# Patient Record
Sex: Male | Born: 2000 | Race: Black or African American | Hispanic: No | Marital: Single | Smoking: Never smoker
Health system: Southern US, Community
[De-identification: ages and names within clinical notes are randomized; demographics above are authoritative.]

## PROBLEM LIST (undated history)

## (undated) DIAGNOSIS — J45909 Unspecified asthma, uncomplicated: Secondary | ICD-10-CM

---
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
## 2020-10-04 ENCOUNTER — Other Ambulatory Visit: Payer: Self-pay

## 2020-10-04 ENCOUNTER — Emergency Department (HOSPITAL_BASED_OUTPATIENT_CLINIC_OR_DEPARTMENT_OTHER)
Admission: EM | Admit: 2020-10-04 | Discharge: 2020-10-04 | Disposition: A | Discharge status: Home / Self Care | Payer: Self-pay | Attending: Emergency Medicine | Admitting: Emergency Medicine

## 2020-10-04 ENCOUNTER — Encounter (HOSPITAL_BASED_OUTPATIENT_CLINIC_OR_DEPARTMENT_OTHER): Payer: Self-pay

## 2020-10-04 ENCOUNTER — Other Ambulatory Visit (HOSPITAL_COMMUNITY): Payer: Self-pay | Admitting: Physician Assistant

## 2020-10-04 DIAGNOSIS — R110 Nausea: Secondary | ICD-10-CM | POA: Insufficient documentation

## 2020-10-04 DIAGNOSIS — R369 Urethral discharge, unspecified: Secondary | ICD-10-CM | POA: Insufficient documentation

## 2020-10-04 DIAGNOSIS — R1030 Lower abdominal pain, unspecified: Secondary | ICD-10-CM | POA: Insufficient documentation

## 2020-10-04 DIAGNOSIS — Z7689 Persons encountering health services in other specified circumstances: Secondary | ICD-10-CM

## 2020-10-04 DIAGNOSIS — R109 Unspecified abdominal pain: Secondary | ICD-10-CM

## 2020-10-04 LAB — URINALYSIS, ROUTINE W REFLEX MICROSCOPIC
Bilirubin Urine: NEGATIVE
Glucose, UA: NEGATIVE mg/dL
Ketones, ur: NEGATIVE mg/dL
Nitrite: NEGATIVE
Protein, ur: NEGATIVE mg/dL
Specific Gravity, Urine: >1.03 — ABNORMAL HIGH (ref 1.005–1.030)
pH: 6 (ref 5.0–8.0)

## 2020-10-04 LAB — HIV ANTIBODY (ROUTINE TESTING W REFLEX): HIV Screen 4th Generation wRfx: NONREACTIVE

## 2020-10-04 LAB — URINALYSIS, MICROSCOPIC (REFLEX): WBC, UA: >50 WBC/hpf (ref 0–5)

## 2020-10-04 MED ORDER — DOXYCYCLINE HYCLATE 100 MG PO CAPS: 100.0000 mg | ORAL_CAPSULE | Freq: Two times a day (BID) | ORAL | 0 refills | Status: DC

## 2020-10-04 MED ORDER — DOXYCYCLINE HYCLATE 100 MG PO TABS
100.0000 mg | ORAL_TABLET | Freq: Once | ORAL | Status: AC
  Administered 2020-10-04: 100 mg via ORAL
  Filled 2020-10-04: qty 1

## 2020-10-04 MED ORDER — KETOROLAC TROMETHAMINE 30 MG/ML IJ SOLN
30.0000 mg | Freq: Once | INTRAMUSCULAR | Status: AC
  Administered 2020-10-04: 30 mg via INTRAVENOUS
  Filled 2020-10-04: qty 1

## 2020-10-04 MED ORDER — CEFTRIAXONE SODIUM 1 G IJ SOLR
1.0000 g | Freq: Once | INTRAMUSCULAR | Status: AC
  Administered 2020-10-04: 1 g via INTRAMUSCULAR
  Filled 2020-10-04: qty 10

## 2020-10-04 MED ORDER — DOXYCYCLINE HYCLATE 100 MG PO CAPS: 100.0000 mg | ORAL_CAPSULE | Freq: Two times a day (BID) | ORAL | 0 refills | Status: AC

## 2020-10-04 MED FILL — DOXYCYCLINE HYCLATE 100 MG: 100 | 7 days supply | Qty: 14 | Fill #0

## 2020-10-04 NOTE — ED Provider Notes (Signed)
[facility] EMERGENCY DEPARTMENT Provider Note   CSN: [id number] Arrival date & time: 10/04/20  1032     History Chief Complaint  Patient presents with   Abdominal Pain    Dennis Klein is a 20 y.o. male.  HPI 20 year old male presents to the ER with lower abdominal pain and some nausea which started around 8 AM this morning.  Denies any vomiting or diarrhea.  Last bowel movement was yesterday and normal.  Describes the pain as cramping.  He denies any fevers or chills, no flank pain.  Denies any dysuria but has had couple episodes of urgency.  Denies any scrotal pain.  He is also had some penile discharge.  Denies any decreased urinary flow or difficulty urinating.  He is sexually active with no barrier method.  He states that "this is also when I came here for to be checked out".    History reviewed. No pertinent past medical history.  There are no problems to display for this patient.   History reviewed. No pertinent surgical history.     History reviewed. No pertinent family history.  Social History   Tobacco Use   Smoking status: Never Smoker   Smokeless tobacco: Never Used  Vaping Use   Vaping Use: Never used  Substance Use Topics   Alcohol use: Yes    Comment: socially   Drug use: Yes    Types: Marijuana    Home Medications Prior to Admission medications   Medication Sig Start Date End Date Taking? Authorizing Provider  doxycycline (VIBRAMYCIN) 100 MG capsule Take 1 capsule (100 mg total) by mouth 2 (two) times daily for 7 days. 10/04/20 10/11/20  Mare Ferrari, PA-C    Allergies    Patient has no known allergies.  Review of Systems   Review of Systems  Gastrointestinal: Positive for abdominal pain and nausea.  Genitourinary: Positive for penile discharge and urgency. Negative for decreased urine volume, penile pain, penile swelling and scrotal swelling.    Physical Exam Updated Vital Signs BP 122/82    Pulse (!) 59    Temp 98.1  F (36.7 C) (Oral)    Resp 18    Ht 5\' 11"  (1.803 m)    Wt 74.4 kg    SpO2 99%    BMI 22.87 kg/m   Physical Exam Vitals and nursing note reviewed.  Constitutional:      Appearance: He is well-developed.  HENT:     Head: Normocephalic and atraumatic.  Eyes:     Conjunctiva/sclera: Conjunctivae normal.  Cardiovascular:     Rate and Rhythm: Normal rate and regular rhythm.     Heart sounds: No murmur heard.   Pulmonary:     Effort: Pulmonary effort is normal. No respiratory distress.     Breath sounds: Normal breath sounds.  Abdominal:     Palpations: Abdomen is soft.     Tenderness: There is no abdominal tenderness. There is no right CVA tenderness or left CVA tenderness.  Genitourinary:    Penis: Normal.      Testes: Normal. Cremasteric reflex is present.        Right: Mass not present.        Left: Mass not present.     Comments: GU exam performed with chaperone Musculoskeletal:     Cervical back: Neck supple.  Skin:    General: Skin is warm and dry.  Neurological:     Mental Status: He is alert.     ED Results /  Procedures / Treatments   Labs (all labs ordered are listed, but only abnormal results are displayed) Labs Reviewed  URINALYSIS, ROUTINE W REFLEX MICROSCOPIC - Abnormal; Notable for the following components:      Result Value   APPearance CLOUDY (*)    Specific Gravity, Urine >1.030 (*)    Hgb urine dipstick TRACE (*)    Leukocytes,Ua SMALL (*)    All other components within normal limits  URINALYSIS, MICROSCOPIC (REFLEX) - Abnormal; Notable for the following components:   Bacteria, UA FEW (*)    All other components within normal limits  URINE CULTURE  RPR  HIV ANTIBODY (ROUTINE TESTING W REFLEX)  GC/CHLAMYDIA PROBE AMP ([place]) NOT AT [facility]    EKG None  Radiology No results found.  Procedures Procedures   Medications Ordered in ED Medications  ketorolac (TORADOL) 30 MG/ML injection 30 mg (30 mg Intravenous Given 10/04/20 1221)   cefTRIAXone (ROCEPHIN) injection 1 g (1 g Intramuscular Given 10/04/20 1313)  doxycycline (VIBRA-TABS) tablet 100 mg (100 mg Oral Given 10/04/20 1314)    ED Course  I have reviewed the triage vital signs and the nursing notes.  Pertinent labs & imaging results that were available during my care of the patient were reviewed by me and considered in my medical decision making (see chart for details).  Clinical Course as of 10/04/20 1330  Mon Oct 04, 2020  1322 20 yo male here with urethral discharge x several days, suprapubic "squeezing" discomfort since yesterday, and some urinary pressure yesterday.  Benign abdominal exam.  Doubt appendicitis or torsion.  UA with trace hgb, small leuks, no nitrites.  Consistent with kidney stone or urethritis.  He is concerned about STI exposure and would like empiric treatment, which is reasonable with his sx.  Okay for discharge afterwards [MT]    Clinical Course User Index [MT] Trifan, Kermit Balo, MD   MDM Rules/Calculators/A&P                          20 year old male with lower abdominal pain, nausea since this morning.  He is concerned about possible STD exposure.  On arrival, vitals reassuring.  Physical exam of the abdomen is benign, no flank tenderness, no abdominal pain.  Penile exam benign.  UA with trace hemoglobin, small leukocytes, few bacteria.  Question kidney stones versus STI, the patient has no flank tenderness and no history of kidney stones.  As per discussion with the patient, will treat prophylactically for gonorrhea and chlamydia.  He also wanted to be tested for HIV and syphilis.  These were sent off.  Patient was informed that these results will be available via MyChart, patient instructed to download the app and follow-up on these results.  Will send home with 7-day course of doxycycline.  Return precautions discussed.  Patient was understanding is agreeable  This was a shared visit with my supervising physician Dr. Renaye Rakers who  independently saw and evaluated the patient & provided guidance in evaluation/management/disposition ,in agreement with care  Final Clinical Impression(s) / ED Diagnoses Final diagnoses:  Abdominal pain, unspecified abdominal location    Rx / DC Orders ED Discharge Orders         Ordered    doxycycline (VIBRAMYCIN) 100 MG capsule  2 times daily,   Status:  Discontinued        10/04/20 1243    doxycycline (VIBRAMYCIN) 100 MG capsule  2 times daily  10/04/20 1243           Mare Ferrari, PA-C 10/04/20 1330    Terald Sleeper, MD 10/04/20 [phone]

## 2020-10-04 NOTE — ED Triage Notes (Signed)
Lower abd pain with associated nausea sine 0800 this am.  Denies any V/D.  Describes the pain as cramping.  Denies any hx of same.

## 2020-10-04 NOTE — Discharge Instructions (Signed)
Do not have sex for 2 weeks Have all partners tested and treated If your test is abnormal, you will be called but you have been treated for Gonorrhea and Chlamydia today.  You will also need to take an antibiotic twice a day for 7 days until finished.  You can also review your results on MyChart.  Your HIV and syphilis results will also be available on MyChart. Practice safe sex and use a condom to prevent infection or unwanted pregnancy Follow up with the Health Department Return to the ER for any new or worsening symptoms

## 2020-10-05 LAB — GC/CHLAMYDIA PROBE AMP (~~LOC~~) NOT AT ARMC
Chlamydia: POSITIVE — AB
Comment: NEGATIVE
Comment: NORMAL
Neisseria Gonorrhea: POSITIVE — AB

## 2020-10-05 LAB — URINE CULTURE: Culture: NO GROWTH

## 2020-10-05 LAB — RPR: RPR Ser Ql: NONREACTIVE

## 2021-02-05 ENCOUNTER — Encounter (HOSPITAL_BASED_OUTPATIENT_CLINIC_OR_DEPARTMENT_OTHER): Payer: Self-pay | Admitting: Emergency Medicine

## 2021-02-05 ENCOUNTER — Other Ambulatory Visit: Payer: Self-pay

## 2021-02-05 ENCOUNTER — Emergency Department (HOSPITAL_BASED_OUTPATIENT_CLINIC_OR_DEPARTMENT_OTHER)
Admission: EM | Admit: 2021-02-05 | Discharge: 2021-02-05 | Disposition: A | Discharge status: Home / Self Care | Payer: Self-pay | Attending: Emergency Medicine | Admitting: Emergency Medicine

## 2021-02-05 DIAGNOSIS — M7601 Gluteal tendinitis, right hip: Secondary | ICD-10-CM | POA: Insufficient documentation

## 2021-02-05 NOTE — ED Provider Notes (Signed)
[facility] EMERGENCY DEPARTMENT Provider Note   CSN: [id number] Arrival date & time: 02/05/21  0736     History Chief Complaint  Patient presents with   Leg Pain    Dennis Klein is a 20 y.o. male.  He does a lot of lifting at work.  The history is provided by the patient.  Leg Pain Location:  Buttock and leg Buttock location:  R buttock Leg location:  R upper leg (lateral aspect) Pain details:    Quality:  Shooting   Radiates to:  Does not radiate   Severity:  Mild   Onset quality:  Gradual   Duration:  4 days   Timing:  Constant   Progression:  Unchanged Chronicity:  New Prior injury to area:  No Relieved by:  NSAIDs Exacerbated by: twisting. Associated symptoms: no back pain, no decreased ROM, no fever, no muscle weakness, no numbness and no tingling       History reviewed. No pertinent past medical history.  There are no problems to display for this patient.   History reviewed. No pertinent surgical history.     No family history on file.  Social History   Tobacco Use   Smoking status: Never   Smokeless tobacco: Never  Vaping Use   Vaping Use: Never used  Substance Use Topics   Alcohol use: Yes    Comment: socially   Drug use: Yes    Types: Marijuana    Home Medications Prior to Admission medications   Medication Sig Start Date End Date Taking? Authorizing Provider  doxycycline (VIBRAMYCIN) 100 MG capsule TAKE 1 CAPSULE BY MOUTH TWICE DAILY FOR 7 DAYS 10/04/20 10/04/21  Mare Ferrari, PA-C    Allergies    Patient has no known allergies.  Review of Systems   Review of Systems  Constitutional:  Negative for chills and fever.  HENT:  Negative for ear pain and sore throat.   Eyes:  Negative for pain and visual disturbance.  Respiratory:  Negative for cough and shortness of breath.   Cardiovascular:  Negative for chest pain and palpitations.  Gastrointestinal:  Negative for abdominal pain and vomiting.  Genitourinary:   Negative for dysuria and hematuria.  Musculoskeletal:  Negative for arthralgias and back pain.  Skin:  Negative for color change and rash.  Neurological:  Negative for seizures and syncope.  All other systems reviewed and are negative.  Physical Exam Updated Vital Signs BP (!) 145/73 (BP Location: Right Arm)   Pulse 65   Resp 17   Ht 6\' 1"  (1.854 m)   Wt 75.8 kg   SpO2 98%   BMI 22.03 kg/m   Physical Exam Vitals and nursing note reviewed.  Constitutional:      Appearance: Normal appearance.  HENT:     Head: Normocephalic and atraumatic.  Eyes:     Conjunctiva/sclera: Conjunctivae normal.  Pulmonary:     Effort: Pulmonary effort is normal. No respiratory distress.  Musculoskeletal:        General: No deformity. Normal range of motion.     Cervical back: Normal range of motion.     Comments: Lumbar spine is normal to inspection and nontender.  Range of motion is normal.  He is tender to palpation at the gluteal tendon insertion and at the proximal IT band.  Increased pain with single-leg stance.  Skin:    General: Skin is warm and dry.  Neurological:     General: No focal deficit present.  Mental Status: He is alert and oriented to person, place, and time. Mental status is at baseline.     Gait: Gait normal.  Psychiatric:        Mood and Affect: Mood normal.    ED Results / Procedures / Treatments   Labs (all labs ordered are listed, but only abnormal results are displayed) Labs Reviewed - No data to display  EKG None  Radiology No results found.  Procedures Procedures   Medications Ordered in ED Medications - No data to display  ED Course  I have reviewed the triage vital signs and the nursing notes.  Pertinent labs & imaging results that were available during my care of the patient were reviewed by me and considered in my medical decision making (see chart for details).    MDM Rules/Calculators/A&P                           Symptoms are most  consistent with a gluteal tendinitis/IT band syndrome.  Less likely is a lumbar spine source.  Symptoms do appear to be musculoskeletal.  He was advised on symptomatic management and given return precautions should symptoms persist.  He was given a 10 pound lifting restriction. Final Clinical Impression(s) / ED Diagnoses Final diagnoses:  Gluteal tendinitis of right buttock    Rx / DC Orders ED Discharge Orders     None        Koleen Distance, MD 02/05/21 [phone]

## 2021-02-05 NOTE — ED Triage Notes (Signed)
Pt reports posterior right leg pain from the buttock to behind the knee.

## 2021-05-10 ENCOUNTER — Encounter (HOSPITAL_BASED_OUTPATIENT_CLINIC_OR_DEPARTMENT_OTHER): Payer: Self-pay | Admitting: *Deleted

## 2021-05-10 ENCOUNTER — Emergency Department (HOSPITAL_BASED_OUTPATIENT_CLINIC_OR_DEPARTMENT_OTHER)
Admission: EM | Admit: 2021-05-10 | Discharge: 2021-05-10 | Disposition: A | Discharge status: Home / Self Care | Payer: Self-pay | Attending: Emergency Medicine | Admitting: Emergency Medicine

## 2021-05-10 ENCOUNTER — Other Ambulatory Visit: Payer: Self-pay

## 2021-05-10 DIAGNOSIS — Z20822 Contact with and (suspected) exposure to covid-19: Secondary | ICD-10-CM | POA: Insufficient documentation

## 2021-05-10 DIAGNOSIS — J101 Influenza due to other identified influenza virus with other respiratory manifestations: Secondary | ICD-10-CM | POA: Insufficient documentation

## 2021-05-10 DIAGNOSIS — R059 Cough, unspecified: Secondary | ICD-10-CM | POA: Diagnosis present

## 2021-05-10 LAB — RESP PANEL BY RT-PCR (FLU A&B, COVID) ARPGX2
Influenza A by PCR: POSITIVE — AB
Influenza B by PCR: NEGATIVE
SARS Coronavirus 2 by RT PCR: NEGATIVE

## 2021-05-10 MED ORDER — ACETAMINOPHEN 325 MG PO TABS
650.0000 mg | ORAL_TABLET | Freq: Once | ORAL | Status: AC
  Administered 2021-05-10: 650 mg via ORAL
  Filled 2021-05-10: qty 2

## 2021-05-10 MED ORDER — BENZONATATE 100 MG PO CAPS: 100.0000 mg | ORAL_CAPSULE | Freq: Three times a day (TID) | ORAL | 0 refills | Status: DC

## 2021-05-10 MED ORDER — PROMETHAZINE-DM 6.25-15 MG/5ML PO SYRP: 5.0000 mL | ORAL_SOLUTION | Freq: Four times a day (QID) | ORAL | 0 refills | Status: DC | PRN

## 2021-05-10 MED ORDER — ONDANSETRON 4 MG PO TBDP: 4.0000 mg | ORAL_TABLET | Freq: Three times a day (TID) | ORAL | 0 refills | Status: DC | PRN

## 2021-05-10 NOTE — ED Triage Notes (Signed)
Flu-like sx x3 days.

## 2021-05-10 NOTE — ED Provider Notes (Signed)
[facility] EMERGENCY DEPARTMENT Provider Note   CSN: [id number] Arrival date & time: 05/10/21  1321     History Chief Complaint  Patient presents with   Fever    Dennis Klein is a 20 y.o. male.  HPI Patient is a 20 year old male presented today to the ER with symptoms of cough congestion fatigue malaise fevers and chills for the past 3 days.  He states he is here because he needs a note for work.  Denies any chest pain shortness of breath no lightheadedness or dizziness no hemoptysis.  States he feels much improved after the medicine here.  No other associated symptoms.  No aggravating mitigating factors.  He is taken no medications prior to arrival in ER.    History reviewed. No pertinent past medical history.  There are no problems to display for this patient.   History reviewed. No pertinent surgical history.     No family history on file.  Social History   Tobacco Use   Smoking status: Never   Smokeless tobacco: Never  Vaping Use   Vaping Use: Never used  Substance Use Topics   Alcohol use: Yes    Comment: socially   Drug use: Yes    Types: Marijuana    Home Medications Prior to Admission medications   Medication Sig Start Date End Date Taking? Authorizing Provider  benzonatate (TESSALON) 100 MG capsule Take 1 capsule (100 mg total) by mouth every 8 (eight) hours. 05/10/21  Yes Jennet Scroggin S, PA  ondansetron (ZOFRAN ODT) 4 MG disintegrating tablet Take 1 tablet (4 mg total) by mouth every 8 (eight) hours as needed for nausea or vomiting. 05/10/21  Yes Joden Bonsall S, PA  promethazine-dextromethorphan (PROMETHAZINE-DM) 6.25-15 MG/5ML syrup Take 5 mLs by mouth 4 (four) times daily as needed for cough. 05/10/21  Yes Delma Villalva, Stevphen Meuse S, PA  doxycycline (VIBRAMYCIN) 100 MG capsule TAKE 1 CAPSULE BY MOUTH TWICE DAILY FOR 7 DAYS 10/04/20 10/04/21  Mare Ferrari, PA-C    Allergies    Patient has no known allergies.  Review of Systems    Review of Systems  Constitutional:  Positive for fatigue and fever. Negative for chills.  HENT:  Positive for congestion. Negative for sinus pain and sore throat.   Eyes:  Negative for pain.  Respiratory:  Positive for cough. Negative for shortness of breath.   Cardiovascular:  Negative for chest pain and leg swelling.  Gastrointestinal:  Negative for abdominal pain, diarrhea, nausea and vomiting.  Genitourinary:  Negative for dysuria.  Musculoskeletal:  Positive for myalgias.  Skin:  Negative for rash.  Neurological:  Negative for dizziness and headaches.   Physical Exam Updated Vital Signs BP (!) 125/58 (BP Location: Right Arm)   Pulse 74   Temp (!) 101.9 F (38.8 C) (Oral)   Resp 16   Ht 6\' 1"  (1.854 m)   Wt 72.6 kg   SpO2 98%   BMI 21.11 kg/m   Physical Exam Vitals and nursing note reviewed.  Constitutional:      General: He is not in acute distress.    Comments: Pleasant well-appearing 20 year old.  In no acute distress.  Sitting comfortably in bed.  Able answer questions appropriately follow commands. No increased work of breathing. Speaking in full sentences.   HENT:     Head: Normocephalic and atraumatic.     Nose: Nose normal.  Eyes:     General: No scleral icterus. Cardiovascular:     Rate and Rhythm: Normal rate and  regular rhythm.     Pulses: Normal pulses.     Heart sounds: Normal heart sounds.  Pulmonary:     Effort: Pulmonary effort is normal. No respiratory distress.     Breath sounds: No wheezing.  Abdominal:     Palpations: Abdomen is soft.     Tenderness: There is no abdominal tenderness.  Musculoskeletal:     Cervical back: Normal range of motion.     Right lower leg: No edema.     Left lower leg: No edema.  Skin:    General: Skin is warm and dry.     Capillary Refill: Capillary refill takes less than 2 seconds.  Neurological:     Mental Status: He is alert. Mental status is at baseline.  Psychiatric:        Mood and Affect: Mood normal.         Behavior: Behavior normal.    ED Results / Procedures / Treatments   Labs (all labs ordered are listed, but only abnormal results are displayed) Labs Reviewed  RESP PANEL BY RT-PCR (FLU A&B, COVID) ARPGX2 - Abnormal; Notable for the following components:      Result Value   Influenza A by PCR POSITIVE (*)    All other components within normal limits    EKG None  Radiology No results found.  Procedures Procedures   Medications Ordered in ED Medications  acetaminophen (TYLENOL) tablet 650 mg (650 mg Oral Given 05/10/21 1408)    ED Course  I have reviewed the triage vital signs and the nursing notes.  Pertinent labs & imaging results that were available during my care of the patient were reviewed by me and considered in my medical decision making (see chart for details).  Clinical Course as of 05/10/21 1824  Tue May 10, 2021  1823 Influenza A By PCR(!): POSITIVE [WF]    Clinical Course User Index [WF] Gailen Shelter, [place]   MDM Rules/Calculators/A&P                          Patient is 20 year old male presented today to the ER test positive for flu in the ER.  He has had symptoms for 3 days.  Discussed Tamiflu if you prefer not to take this medication.  I think this is a very reasonable  Recommended conservative therapy.  Tylenol ibuprofen fluids Zyrtec etc.  No NVD.  No chest pain or shortness of breath.  Medications at the pharmacy for him.  4 tablets of Zofran in case he experiences any nausea.  Dennis Klein was evaluated in Emergency Department on 05/10/2021 for the symptoms described in the history of present illness. He was evaluated in the context of the global COVID-19 pandemic, which necessitated consideration that the patient might be at risk for infection with the SARS-CoV-2 virus that causes COVID-19. Institutional protocols and algorithms that pertain to the evaluation of patients at risk for COVID-19 are in a state of rapid change based on  information released by regulatory bodies including the CDC and federal and state organizations. These policies and algorithms were followed during the patient's care in the ED.   Final Clinical Impression(s) / ED Diagnoses Final diagnoses:  Influenza A    Rx / DC Orders ED Discharge Orders          Ordered    ondansetron (ZOFRAN ODT) 4 MG disintegrating tablet  Every 8 hours PRN        05/10/21 1822  benzonatate (TESSALON) 100 MG capsule  Every 8 hours        05/10/21 1822    promethazine-dextromethorphan (PROMETHAZINE-DM) 6.25-15 MG/5ML syrup  4 times daily PRN        05/10/21 1822             Gailen Shelter, [place] 05/10/21 1848    Charlynne Pander, MD 05/10/21 2325

## 2021-05-10 NOTE — Discharge Instructions (Addendum)
You tested positive for influenza A  This is a self-limited disease.  It will improve with time.  I have prescribed you 4 tablets of Zofran to use for nausea if needed.  Drink plenty of water take Tylenol and ibuprofen as discussed below  Please use Tylenol or ibuprofen for pain.  You may use 600 mg ibuprofen every 6 hours or 1000 mg of Tylenol every 6 hours.  You may choose to alternate between the 2.  This would be most effective.  Not to exceed 4 g of Tylenol within 24 hours.  Not to exceed 3200 mg ibuprofen 24 hours.   Viral Illness TREATMENT  Treatment is directed at relieving symptoms. There is no cure. Antibiotics are not effective, because the infection is caused by a virus, not by bacteria. Treatment may include:  Increased fluid intake. Sports drinks offer valuable electrolytes, sugars, and fluids.  Breathing heated mist or steam (vaporizer or shower).  Eating chicken soup or other clear broths, and maintaining good nutrition.  Getting plenty of rest.  Using gargles or lozenges for comfort.  Increasing usage of your inhaler if you have asthma.  Return to work when your temperature has returned to normal.  Gargle warm salt water and spit it out for sore throat. Take benadryl to decrease sinus secretions. Continue to alternate between Tylenol and ibuprofen for pain and fever control.  Follow Up: Follow up with your primary care doctor in 5-7 days for recheck of ongoing symptoms.  Return to emergency department for emergent changing or worsening of symptoms.

## 2021-08-29 ENCOUNTER — Other Ambulatory Visit: Payer: Self-pay

## 2021-08-29 ENCOUNTER — Emergency Department (HOSPITAL_BASED_OUTPATIENT_CLINIC_OR_DEPARTMENT_OTHER)
Admission: EM | Admit: 2021-08-29 | Discharge: 2021-08-29 | Discharge status: Against Medical Advice | Payer: 59 | Attending: Emergency Medicine | Admitting: Emergency Medicine

## 2021-08-29 ENCOUNTER — Encounter (HOSPITAL_BASED_OUTPATIENT_CLINIC_OR_DEPARTMENT_OTHER): Payer: Self-pay

## 2021-08-29 DIAGNOSIS — R300 Dysuria: Secondary | ICD-10-CM | POA: Insufficient documentation

## 2021-08-29 DIAGNOSIS — Z202 Contact with and (suspected) exposure to infections with a predominantly sexual mode of transmission: Secondary | ICD-10-CM | POA: Insufficient documentation

## 2021-08-29 DIAGNOSIS — N489 Disorder of penis, unspecified: Secondary | ICD-10-CM | POA: Insufficient documentation

## 2021-08-29 LAB — URINALYSIS, MICROSCOPIC (REFLEX): WBC, UA: >50 WBC/hpf (ref 0–5)

## 2021-08-29 LAB — URINALYSIS, ROUTINE W REFLEX MICROSCOPIC
Bilirubin Urine: NEGATIVE
Glucose, UA: NEGATIVE mg/dL
Hgb urine dipstick: NEGATIVE
Ketones, ur: NEGATIVE mg/dL
Nitrite: NEGATIVE
Protein, ur: NEGATIVE mg/dL
Specific Gravity, Urine: 1.025 (ref 1.005–1.030)
pH: 6.5 (ref 5.0–8.0)

## 2021-08-29 NOTE — ED Triage Notes (Signed)
Pt c/o penile d/c, dysuria x 2 weeks-requesting STD check-NAD-steady gait

## 2021-08-30 ENCOUNTER — Other Ambulatory Visit: Payer: Self-pay

## 2021-08-30 ENCOUNTER — Emergency Department (HOSPITAL_BASED_OUTPATIENT_CLINIC_OR_DEPARTMENT_OTHER)
Admission: EM | Admit: 2021-08-30 | Discharge: 2021-08-30 | Disposition: A | Discharge status: Home / Self Care | Payer: 59 | Attending: Emergency Medicine | Admitting: Emergency Medicine

## 2021-08-30 ENCOUNTER — Other Ambulatory Visit (HOSPITAL_BASED_OUTPATIENT_CLINIC_OR_DEPARTMENT_OTHER): Payer: Self-pay

## 2021-08-30 ENCOUNTER — Encounter (HOSPITAL_BASED_OUTPATIENT_CLINIC_OR_DEPARTMENT_OTHER): Payer: Self-pay

## 2021-08-30 DIAGNOSIS — Z202 Contact with and (suspected) exposure to infections with a predominantly sexual mode of transmission: Secondary | ICD-10-CM

## 2021-08-30 DIAGNOSIS — R369 Urethral discharge, unspecified: Secondary | ICD-10-CM

## 2021-08-30 DIAGNOSIS — R300 Dysuria: Secondary | ICD-10-CM

## 2021-08-30 MED ORDER — CEFTRIAXONE SODIUM 1 G IJ SOLR
1.0000 g | Freq: Once | INTRAMUSCULAR | Status: AC
  Administered 2021-08-30: 1 g via INTRAMUSCULAR
  Filled 2021-08-30: qty 10

## 2021-08-30 MED ORDER — LIDOCAINE HCL (PF) 1 % IJ SOLN
INTRAMUSCULAR | Status: AC
  Filled 2021-08-30: qty 5

## 2021-08-30 MED ORDER — AZITHROMYCIN 250 MG PO TABS
250.0000 mg | ORAL_TABLET | Freq: Every day | ORAL | 0 refills | Status: DC
  Filled 2021-08-30: qty 6, 6d supply, fill #0

## 2021-08-30 NOTE — Discharge Instructions (Addendum)
You were seen in the emergency department today for an STD check.  As we discussed gonorrhea and chlamydia with most common STDs.  We test this based on your urine, and these results should come back in the next day or so.  You can sign up for [facility] MyChart to access your medical records and test results using this link: [email or web address]   We have given you a one-time dose of antibiotics for gonorrhea.  The treatment for chlamydia is short course of antibiotics orally.  I have printed the prescription for this antibiotic, and I only want you to fill it and take it if your test is positive.  [employer] ([address] [place]) offers free and confidential STD testing Monday-Friday from 9am-4pm. You can call [phone] and make an appointment if you require any testing in the future.

## 2021-08-30 NOTE — ED Provider Notes (Signed)
[name] EMERGENCY DEPARTMENT Provider Note   CSN: [id number] Arrival date & time: 08/30/21  1443     History  No chief complaint on file.   Dennis Klein is a 21 y.o. male who presents emergency department complaining of penile discharge and dysuria for 2 weeks.  He is requesting an STD test.  He presented to emergency department yesterday, but left without being seen due to wait time.  No fevers, chills, abdominal pain, flank pain.  HPI     Home Medications Prior to Admission medications   Medication Sig Start Date End Date Taking? Authorizing Provider  azithromycin (ZITHROMAX) 250 MG tablet Take 1 tablet (250 mg total) by mouth daily. Take first 2 tablets together, then 1 every day until finished. 08/30/21  Yes Marcellus Pulliam T, PA-C  benzonatate (TESSALON) 100 MG capsule Take 1 capsule (100 mg total) by mouth every 8 (eight) hours. 05/10/21   Tedd Sias, PA  doxycycline (VIBRAMYCIN) 100 MG capsule TAKE 1 CAPSULE BY MOUTH TWICE DAILY FOR 7 DAYS 10/04/20 10/04/21  Sharyn Lull A, PA-C  ondansetron (ZOFRAN ODT) 4 MG disintegrating tablet Take 1 tablet (4 mg total) by mouth every 8 (eight) hours as needed for nausea or vomiting. 05/10/21   Tedd Sias, PA  promethazine-dextromethorphan (PROMETHAZINE-DM) 6.25-15 MG/5ML syrup Take 5 mLs by mouth 4 (four) times daily as needed for cough. 05/10/21   Tedd Sias, PA      Allergies    Patient has no known allergies.    Review of Systems   Review of Systems  Constitutional:  Negative for chills and fever.  Gastrointestinal:  Negative for abdominal pain.  Genitourinary:  Positive for dysuria and penile discharge. Negative for difficulty urinating, flank pain, frequency, genital sores, hematuria, penile pain, penile swelling, scrotal swelling, testicular pain and urgency.  All other systems reviewed and are negative.  Physical Exam Updated Vital Signs BP 125/71    Pulse 67    Temp 98.5 F (36.9 C)  (Oral)    Resp 18    SpO2 98%  Physical Exam Vitals and nursing note reviewed. Exam conducted with a chaperone present.  Constitutional:      Appearance: Normal appearance.  HENT:     Head: Normocephalic and atraumatic.  Eyes:     Conjunctiva/sclera: Conjunctivae normal.  Pulmonary:     Effort: Pulmonary effort is normal. No respiratory distress.  Genitourinary:    Penis: Circumcised. Discharge present.      Testes: Normal.     Epididymis:     Right: Normal.     Left: Normal.  Skin:    General: Skin is warm and dry.  Neurological:     Mental Status: He is alert.  Psychiatric:        Mood and Affect: Mood normal.        Behavior: Behavior normal.    ED Results / Procedures / Treatments   Labs (all labs ordered are listed, but only abnormal results are displayed) Labs Reviewed  GC/CHLAMYDIA PROBE AMP ([place]) NOT AT [facility]    EKG None  Radiology No results found.  Procedures Procedures    Medications Ordered in ED Medications  cefTRIAXone (ROCEPHIN) injection 1 g (1 g Intramuscular Given 08/30/21 1554)  lidocaine (PF) (XYLOCAINE) 1 % injection (  Given 08/30/21 1558)    ED Course/ Medical Decision Making/ A&P  Medical Decision Making  Patient is otherwise healthy 21 year old male presents the emergency department requesting STD testing due to 2 weeks of dysuria and penile discharge.  He denies fevers, chills, penile or scrotal pain, abdominal pain, flank pain.  On my exam patient is afebrile, not tachycardic, and in no acute distress.  GU exam is normal as above with the exception of visualized penile discharge.  Urinalysis was collected yesterday when patient was triaged before he left without being seen.  Additional records include lab results from yesterday that I personally interpreted, that showed small leukocytes but otherwise normal urinalysis.  Gonorrhea/chlamydia testing was not collected yesterday, we will collect  today.  Explained urinalysis results from yesterday to the patient.  Explained that his gonorrhea and Chlamydia testing is pending, and the results should be available in the neck several days.  Patient given one-time dose of Rocephin, and printed prescription for azithromycin for him to fill if chlamydia testing is positive.  With a negative urinalysis, no fever, no abdominal or flank pain, I have low concern for developing worsening infection.  We discussed reasons to return to the emergency department, patient is agreeable to the plan.  Final Clinical Impression(s) / ED Diagnoses Final diagnoses:  Possible exposure to STD  Penile discharge  Dysuria    Rx / DC Orders ED Discharge Orders          Ordered    azithromycin (ZITHROMAX) 250 MG tablet  Daily        08/30/21 1553           Portions of this report may have been transcribed using voice recognition software. Every effort was made to ensure accuracy; however, inadvertent computerized transcription errors may be present.    Kateri Plummer, PA-C 08/30/21 [place], DO 08/30/21 1702

## 2021-08-30 NOTE — ED Triage Notes (Signed)
Pt c/o penile d/c, dysuria x 2 weeks-requesting STD check-NAD-steady gait-LWBS yesterday

## 2021-08-31 LAB — GC/CHLAMYDIA PROBE AMP (~~LOC~~) NOT AT ARMC
Chlamydia: POSITIVE — AB
Comment: NEGATIVE
Comment: NORMAL
Neisseria Gonorrhea: POSITIVE — AB

## 2021-12-21 ENCOUNTER — Emergency Department (HOSPITAL_BASED_OUTPATIENT_CLINIC_OR_DEPARTMENT_OTHER)
Admission: EM | Admit: 2021-12-21 | Discharge: 2021-12-21 | Disposition: A | Discharge status: Home / Self Care | Payer: Self-pay | Attending: Emergency Medicine | Admitting: Emergency Medicine

## 2021-12-21 ENCOUNTER — Other Ambulatory Visit: Payer: Self-pay

## 2021-12-21 ENCOUNTER — Other Ambulatory Visit (HOSPITAL_BASED_OUTPATIENT_CLINIC_OR_DEPARTMENT_OTHER): Payer: Self-pay

## 2021-12-21 ENCOUNTER — Emergency Department (HOSPITAL_BASED_OUTPATIENT_CLINIC_OR_DEPARTMENT_OTHER): Payer: Self-pay

## 2021-12-21 ENCOUNTER — Encounter (HOSPITAL_BASED_OUTPATIENT_CLINIC_OR_DEPARTMENT_OTHER): Payer: Self-pay

## 2021-12-21 DIAGNOSIS — J45909 Unspecified asthma, uncomplicated: Secondary | ICD-10-CM | POA: Insufficient documentation

## 2021-12-21 DIAGNOSIS — S93601A Unspecified sprain of right foot, initial encounter: Secondary | ICD-10-CM | POA: Insufficient documentation

## 2021-12-21 DIAGNOSIS — Y9389 Activity, other specified: Secondary | ICD-10-CM | POA: Insufficient documentation

## 2021-12-21 DIAGNOSIS — M7989 Other specified soft tissue disorders: Secondary | ICD-10-CM | POA: Insufficient documentation

## 2021-12-21 DIAGNOSIS — X501XXA Overexertion from prolonged static or awkward postures, initial encounter: Secondary | ICD-10-CM | POA: Insufficient documentation

## 2021-12-21 HISTORY — DX: Unspecified asthma, uncomplicated: J45.909

## 2021-12-21 MED ORDER — IBUPROFEN 600 MG PO TABS
600.0000 mg | ORAL_TABLET | Freq: Four times a day (QID) | ORAL | 0 refills | Status: DC | PRN
  Filled 2021-12-21: qty 30, 8d supply, fill #0

## 2021-12-21 MED ORDER — IBUPROFEN 800 MG PO TABS
800.0000 mg | ORAL_TABLET | Freq: Once | ORAL | Status: AC
  Administered 2021-12-21: 800 mg via ORAL
  Filled 2021-12-21: qty 1

## 2021-12-21 NOTE — ED Triage Notes (Signed)
Twisted right ankle yesterday. Now having swelling/pain

## 2021-12-21 NOTE — ED Provider Notes (Signed)
[facility] EMERGENCY DEPARTMENT Provider Note   CSN: [id number] Arrival date & time: 12/21/21  0715     History  Chief Complaint  Patient presents with   Ankle Pain    Dennis IsaacsZachary Klein is a 21 y.o. male.  Pt is a 21 yo male with a pmhx significant for asthma.  He was playing kickball yesterday.  He rolled his ankle and has been having pain ever since.  Pt said he was able to walk on it a little bit.  Now, he is unable to walk on it due to the pain.  He has not taken any medication for it.  He did put an ice pack on his foot.      Home Medications Prior to Admission medications   Medication Sig Start Date End Date Taking? Authorizing Provider  ibuprofen (ADVIL) 600 MG tablet Take 1 tablet (600 mg total) by mouth every 6 (six) hours as needed. 12/21/21  Yes Jacalyn LefevreHaviland, Bryanna Yim, MD  azithromycin (ZITHROMAX) 250 MG tablet Take 2 tablets by mouth together on day 1, then take 1 tablet daily until finished 08/30/21   Roemhildt, Lorin T, PA-C  benzonatate (TESSALON) 100 MG capsule Take 1 capsule (100 mg total) by mouth every 8 (eight) hours. 05/10/21   Fondaw, Rodrigo RanWylder S, PA  ondansetron (ZOFRAN ODT) 4 MG disintegrating tablet Take 1 tablet (4 mg total) by mouth every 8 (eight) hours as needed for nausea or vomiting. 05/10/21   Gailen ShelterFondaw, Wylder S, PA  promethazine-dextromethorphan (PROMETHAZINE-DM) 6.25-15 MG/5ML syrup Take 5 mLs by mouth 4 (four) times daily as needed for cough. 05/10/21   Gailen ShelterFondaw, Wylder S, PA      Allergies    Patient has no known allergies.    Review of Systems   Review of Systems  Musculoskeletal:        Right ankle and foot pain  All other systems reviewed and are negative.  Physical Exam Updated Vital Signs BP 134/78 (BP Location: Right Arm)   Pulse 65   Temp 98.3 F (36.8 C) (Oral)   Resp 18   Ht 6\' 1"  (1.854 m)   Wt 78.5 kg   SpO2 97%   BMI 22.82 kg/m  Physical Exam Vitals and nursing note reviewed.  Constitutional:      Appearance: Normal  appearance.  HENT:     Head: Normocephalic and atraumatic.     Right Ear: External ear normal.     Left Ear: External ear normal.     Nose: Nose normal.     Mouth/Throat:     Mouth: Mucous membranes are moist.     Pharynx: Oropharynx is clear.  Eyes:     Extraocular Movements: Extraocular movements intact.     Conjunctiva/sclera: Conjunctivae normal.     Pupils: Pupils are equal, round, and reactive to light.  Cardiovascular:     Rate and Rhythm: Normal rate and regular rhythm.     Pulses: Normal pulses.     Heart sounds: Normal heart sounds.  Pulmonary:     Effort: Pulmonary effort is normal.     Breath sounds: Normal breath sounds.  Abdominal:     General: Abdomen is flat. Bowel sounds are normal.     Palpations: Abdomen is soft.  Musculoskeletal:     Cervical back: Normal range of motion and neck supple.       Legs:  Skin:    General: Skin is warm.     Capillary Refill: Capillary refill takes less than 2 seconds.  Neurological:     General: No focal deficit present.     Mental Status: He is alert and oriented to person, place, and time.  Psychiatric:        Mood and Affect: Mood normal.        Behavior: Behavior normal.    ED Results / Procedures / Treatments   Labs (all labs ordered are listed, but only abnormal results are displayed) Labs Reviewed - No data to display  EKG None  Radiology DG Ankle Complete Right  Result Date: 12/21/2021 CLINICAL DATA:  Twisted ankle yesterday with pain and swelling. EXAM: RIGHT ANKLE - COMPLETE 3+ VIEW COMPARISON:  None Available. FINDINGS: There is no evidence of fracture, dislocation, or joint effusion. There is no evidence of arthropathy or other focal bone abnormality. Soft tissues are unremarkable. IMPRESSION: Negative. Electronically Signed   By: Sherian Rein M.D.   On: 12/21/2021 07:57   DG Foot Complete Right  Result Date: 12/21/2021 CLINICAL DATA:  Twisted ankle yesterday with pain and swelling. EXAM: RIGHT FOOT  COMPLETE - 3+ VIEW COMPARISON:  None Available. FINDINGS: There is no evidence of fracture or dislocation. There is no evidence of arthropathy or other focal bone abnormality. Soft tissues are unremarkable. IMPRESSION: Negative. Electronically Signed   By: Sherian Rein M.D.   On: 12/21/2021 07:58    Procedures Procedures    Medications Ordered in ED Medications  ibuprofen (ADVIL) tablet 800 mg (800 mg Oral Given 12/21/21 0751)    ED Course/ Medical Decision Making/ A&P                           Medical Decision Making Amount and/or Complexity of Data Reviewed Radiology: ordered.  Risk Prescription drug management.   This patient presents to the ED for concern of ankle pain, this involves an extensive number of treatment options, and is a complaint that carries with it a high risk of complications and morbidity.  The differential diagnosis includes ankle/foot fx   Co morbidities that complicate the patient evaluation  asthma   Additional history obtained:  Additional history obtained from epic chart review   Imaging Studies ordered:  I ordered imaging studies including right foot and ankle  I independently visualized and interpreted imaging which showed  Foot:   IMPRESSION:  Negative.  Ankle:   IMPRESSION:  Negative.   I agree with the radiologist interpretation   Medicines ordered and prescription drug management:  I ordered medication including ibuprofen  for pain  Reevaluation of the patient after these medicines showed that the patient improved I have reviewed the patients home medicines and have made adjustments as needed   Problem List / ED Course:  Right foot sprain:  pt placed in a cam boot.  He is told to f/u with ortho.  He is given a rx for ibuprofen.  He is to return if worse.   Reevaluation:  After the interventions noted above, I reevaluated the patient and found that they have :improved   Social Determinants of Health:  No  insurance   Dispostion:  After consideration of the diagnostic results and the patients response to treatment, I feel that the patent would benefit from discharge with outpatient f/u.          Final Clinical Impression(s) / ED Diagnoses Final diagnoses:  Sprain of right foot, initial encounter    Rx / DC Orders ED Discharge Orders  Ordered    ibuprofen (ADVIL) 600 MG tablet  Every 6 hours PRN        12/21/21 7510              Jacalyn Lefevre, MD 12/21/21 [phone]

## 2021-12-28 ENCOUNTER — Other Ambulatory Visit (HOSPITAL_BASED_OUTPATIENT_CLINIC_OR_DEPARTMENT_OTHER): Payer: Self-pay

## 2021-12-30 ENCOUNTER — Other Ambulatory Visit (HOSPITAL_BASED_OUTPATIENT_CLINIC_OR_DEPARTMENT_OTHER): Payer: Self-pay

## 2022-02-23 ENCOUNTER — Emergency Department (HOSPITAL_BASED_OUTPATIENT_CLINIC_OR_DEPARTMENT_OTHER)
Admission: EM | Admit: 2022-02-23 | Discharge: 2022-02-23 | Disposition: A | Discharge status: Home / Self Care | Payer: Self-pay | Attending: Emergency Medicine | Admitting: Emergency Medicine

## 2022-02-23 ENCOUNTER — Encounter (HOSPITAL_BASED_OUTPATIENT_CLINIC_OR_DEPARTMENT_OTHER): Payer: Self-pay | Admitting: Emergency Medicine

## 2022-02-23 DIAGNOSIS — J45909 Unspecified asthma, uncomplicated: Secondary | ICD-10-CM | POA: Insufficient documentation

## 2022-02-23 DIAGNOSIS — N342 Other urethritis: Secondary | ICD-10-CM | POA: Insufficient documentation

## 2022-02-23 MED ORDER — AZITHROMYCIN 1 G PO PACK
1.0000 g | PACK | Freq: Once | ORAL | Status: AC
  Administered 2022-02-23: 1 g via ORAL
  Filled 2022-02-23: qty 1

## 2022-02-23 MED ORDER — CEFTRIAXONE SODIUM 500 MG IJ SOLR
500.0000 mg | Freq: Once | INTRAMUSCULAR | Status: AC
  Administered 2022-02-23: 500 mg via INTRAMUSCULAR
  Filled 2022-02-23: qty 500

## 2022-02-23 NOTE — ED Triage Notes (Signed)
Pt is concerned he may have an STD  Pt states he has been having some penile discharge for about a week

## 2022-02-23 NOTE — ED Provider Notes (Signed)
[facility] Provider Note: Lowella Dell, MD, FACEP  CSN: [id number] MRN: [id number] ARRIVAL: 02/23/22 at 0558 ROOM: MH02/MH02   CHIEF COMPLAINT  Penile Discharge   HISTORY OF PRESENT ILLNESS  02/23/22 6:10 AM Dennis Klein is a 21 y.o. male with a history of STDs in the past.  He is here with penile discharge for about a week and is concerned he may have an STD.  He describes the discharge as clear and white.  It is not profuse.  He is a gay male who practices only insertive intercourse.   Past Medical History:  Diagnosis Date   Asthma     History reviewed. No pertinent surgical history.  Family History  Problem Relation Age of Onset   Diabetes Mother    Cancer Other     Social History   Tobacco Use   Smoking status: Never   Smokeless tobacco: Never  Vaping Use   Vaping Use: Former  Substance Use Topics   Alcohol use: Yes    Comment: socially   Drug use: Yes    Types: Marijuana    Prior to Admission medications   Not on File    Allergies Patient has no known allergies.   REVIEW OF SYSTEMS  Negative except as noted here or in the History of Present Illness.   PHYSICAL EXAMINATION  Initial Vital Signs Blood pressure 130/73, pulse (!) 58, temperature 97.7 F (36.5 C), temperature source Oral, resp. rate 16, height 6\' 1"  (1.854 m), weight 81.6 kg, SpO2 98 %.  Examination General: Well-developed, well-nourished male in no acute distress; appearance consistent with age of record HENT: normocephalic; atraumatic Eyes: Normal appearance Neck: supple Heart: regular rate and rhythm Lungs: clear to auscultation bilaterally Abdomen: soft; nondistended; nontender; bowel sounds present GU: Tanner V male, circumcised; no urethral discharge (but patient had just voided) Extremities: No deformity; full range of motion Neurologic: Awake, alert and oriented; motor function intact in all extremities and symmetric; no facial droop Skin: Warm and  dry Psychiatric: Normal mood and affect   RESULTS  Summary of this visit's results, reviewed and interpreted by myself:   EKG Interpretation  Date/Time:    Ventricular Rate:    PR Interval:    QRS Duration:   QT Interval:    QTC Calculation:   R Axis:     Text Interpretation:         Laboratory Studies: No results found for this or any previous visit (from the past 24 hour(s)). Imaging Studies: No results found.  ED COURSE and MDM  Nursing notes, initial and subsequent vitals signs, including pulse oximetry, reviewed and interpreted by myself.  Vitals:   02/23/22 0606 02/23/22 0608  BP:  130/73  Pulse:  (!) 58  Resp:  16  Temp:  97.7 F (36.5 C)  TempSrc:  Oral  SpO2:  98%  Weight: 81.6 kg   Height: 6\' 1"  (1.854 m)    Medications  cefTRIAXone (ROCEPHIN) injection 500 mg (has no administration in time range)  azithromycin (ZITHROMAX) powder 1 g (has no administration in time range)   Will treat for urethritis given symptomatology.  Will prefer the Zithromax over a week of doxycycline out of concerns for compliance.  As he does not engage in receptive anal intercourse the full week of doxycycline for rectal chlamydia is not required   PROCEDURES  Procedures   ED DIAGNOSES     ICD-10-CM   1. Urethritis  N34.2  Dennis Klein, Dennis Ruiz, MD 02/23/22 [phone]

## 2022-02-24 LAB — GC/CHLAMYDIA PROBE AMP (~~LOC~~) NOT AT ARMC
Chlamydia: NEGATIVE
Comment: NEGATIVE
Comment: NORMAL
Neisseria Gonorrhea: NEGATIVE

## 2022-03-23 ENCOUNTER — Emergency Department (HOSPITAL_BASED_OUTPATIENT_CLINIC_OR_DEPARTMENT_OTHER)
Admission: EM | Admit: 2022-03-23 | Discharge: 2022-03-23 | Disposition: A | Discharge status: Home / Self Care | Payer: Self-pay | Attending: Emergency Medicine | Admitting: Emergency Medicine

## 2022-03-23 ENCOUNTER — Encounter (HOSPITAL_BASED_OUTPATIENT_CLINIC_OR_DEPARTMENT_OTHER): Payer: Self-pay | Admitting: Emergency Medicine

## 2022-03-23 DIAGNOSIS — R109 Unspecified abdominal pain: Secondary | ICD-10-CM | POA: Insufficient documentation

## 2022-03-23 DIAGNOSIS — R112 Nausea with vomiting, unspecified: Secondary | ICD-10-CM | POA: Insufficient documentation

## 2022-03-23 DIAGNOSIS — J45909 Unspecified asthma, uncomplicated: Secondary | ICD-10-CM | POA: Insufficient documentation

## 2022-03-23 NOTE — ED Provider Notes (Signed)
   Emergency Department Provider Note   I have reviewed the triage vital signs and the nursing notes.   HISTORY  Chief Complaint Abdominal Pain   HPI Dennis Klein is a 21 y.o. male with past history of asthma presents to the emergency department with nausea and vomiting yesterday.  His symptoms have since resolved.  He came to the ED yesterday but the wait times were Garrus Gauthreaux and so ultimately left but returns today for clearance to go back to work.  He states he is feeling well.  Is not having abdominal or chest pain.  No diarrhea.  He does not feel that he needs medication for nausea or vomiting.  No fevers or chills.   Past Medical History:  Diagnosis Date   Asthma     Review of Systems  Constitutional: No fever/chills Cardiovascular: Denies chest pain. Respiratory: Denies shortness of breath. Gastrointestinal: No abdominal pain. Positive nausea and vomiting.  Genitourinary: Negative for dysuria. Musculoskeletal: Negative for back pain. Skin: Negative for rash. Neurological: Negative for headaches.  ____________________________________________   PHYSICAL EXAM:  VITAL SIGNS: ED Triage Vitals  Enc Vitals Group     BP 03/23/22 0649 121/67     Pulse Rate 03/23/22 0649 73     Resp 03/23/22 0649 16     Temp 03/23/22 0649 97.6 F (36.4 C)     Temp Source 03/23/22 0649 Oral     SpO2 03/23/22 0649 97 %     Weight 03/23/22 0646 175 lb (79.4 kg)     Height 03/23/22 0646 6\' 1"  (1.854 m)   Constitutional: Alert and oriented. Well appearing and in no acute distress. Eyes: Conjunctivae are normal.  Head: Atraumatic. Nose: No congestion/rhinnorhea. Mouth/Throat: Mucous membranes are moist.   Neck: No stridor.   Cardiovascular: Good peripheral circulation.  Respiratory: Normal respiratory effort.   Gastrointestinal: No distention.  Musculoskeletal: No gross deformities of extremities. Neurologic:  Normal speech and language.  Skin:  Skin is warm, dry and intact. No  rash noted.   ____________________________________________   PROCEDURES  Procedure(s) performed:   Procedures  None  ____________________________________________   INITIAL IMPRESSION / ASSESSMENT AND PLAN / ED COURSE  Pertinent labs & imaging results that were available during my care of the patient were reviewed by me and considered in my medical decision making (see chart for details).  Medical Decision Making: Summary: Patient presents emergency department with nausea and vomiting which is since resolved.  He is requesting a work note.  He is looking well.  Vital signs within normal limits.  No symptoms currently.  He is feeling well and wishes to go back to work this morning.  Work note provided. Patient declines any home meds for symptoms.   Disposition: discharge  ____________________________________________  FINAL CLINICAL IMPRESSION(S) / ED DIAGNOSES  Final diagnoses:  Nausea and vomiting, unspecified vomiting type    Note:  This document was prepared using Dragon voice recognition software and may include unintentional dictation errors.  , MD, [facility]    Antawan Mchugh, [facility], MD 03/23/22 [phone]

## 2022-03-23 NOTE — ED Triage Notes (Signed)
Pt states he had a stomach bug yesterday and came here but the wait was too long so he left  Pt states he missed work so he needs a work note  Pt states he feels better today but his stomach just "feels a little weak"

## 2022-08-29 ENCOUNTER — Emergency Department (HOSPITAL_BASED_OUTPATIENT_CLINIC_OR_DEPARTMENT_OTHER)
Admission: EM | Admit: 2022-08-29 | Discharge: 2022-08-29 | Disposition: A | Discharge status: Home / Self Care | Payer: Self-pay | Attending: Emergency Medicine | Admitting: Emergency Medicine

## 2022-08-29 ENCOUNTER — Encounter (HOSPITAL_BASED_OUTPATIENT_CLINIC_OR_DEPARTMENT_OTHER): Payer: Self-pay

## 2022-08-29 ENCOUNTER — Other Ambulatory Visit: Payer: Self-pay

## 2022-08-29 DIAGNOSIS — N342 Other urethritis: Secondary | ICD-10-CM

## 2022-08-29 DIAGNOSIS — N341 Nonspecific urethritis: Secondary | ICD-10-CM | POA: Insufficient documentation

## 2022-08-29 DIAGNOSIS — J45909 Unspecified asthma, uncomplicated: Secondary | ICD-10-CM | POA: Insufficient documentation

## 2022-08-29 LAB — HIV ANTIBODY (ROUTINE TESTING W REFLEX): HIV Screen 4th Generation wRfx: NONREACTIVE

## 2022-08-29 MED ORDER — DOXYCYCLINE HYCLATE 100 MG PO TABS: 100.0000 mg | ORAL_TABLET | Freq: Once | ORAL | Status: DC

## 2022-08-29 MED ORDER — CEFTRIAXONE SODIUM 500 MG IJ SOLR
500.0000 mg | Freq: Once | INTRAMUSCULAR | Status: AC
  Administered 2022-08-29: 500 mg via INTRAMUSCULAR
  Filled 2022-08-29: qty 500

## 2022-08-29 MED ORDER — DOXYCYCLINE HYCLATE 100 MG PO CAPS: 100.0000 mg | ORAL_CAPSULE | Freq: Two times a day (BID) | ORAL | 0 refills | Status: DC

## 2022-08-29 MED ORDER — AZITHROMYCIN 1 G PO PACK
1.0000 g | PACK | Freq: Once | ORAL | Status: AC
  Administered 2022-08-29: 1 g via ORAL
  Filled 2022-08-29: qty 1

## 2022-08-29 NOTE — ED Triage Notes (Signed)
Protected and unprotected intercourse. Pt endorses a small amount of clear/white discharge a few weeks back.

## 2022-08-29 NOTE — ED Provider Notes (Addendum)
   [facility] Provider Note: Georgena Spurling, MD, FACEP  CSN: [id number] MRN: [id number] ARRIVAL: 08/29/22 at [place]: [place]  STD Exposure   HISTORY OF PRESENT ILLNESS  08/29/22 5:57 AM Dennis Klein is a 22 y.o. male who has had protected and unprotected sexual intercourse.  He has had clear to white urethral discharge for the past 2 weeks.  It is not associated with burning with urination.  He is having no abdominal pain.   Past Medical History:  Diagnosis Date   Asthma     History reviewed. No pertinent surgical history.  Family History  Problem Relation Age of Onset   Diabetes Mother    Cancer Other     Social History   Tobacco Use   Smoking status: Never   Smokeless tobacco: Never  Vaping Use   Vaping Use: Former  Substance Use Topics   Alcohol use: Yes    Comment: socially   Drug use: Yes    Types: Marijuana    Prior to Admission medications   Not on File    Allergies Patient has no known allergies.   REVIEW OF SYSTEMS  Negative except as noted here or in the History of Present Illness.   PHYSICAL EXAMINATION  Initial Vital Signs Blood pressure 131/73, pulse 67, temperature 98.4 F (36.9 C), temperature source Oral, resp. rate 16, SpO2 98 %.  Examination General: Well-developed, well-nourished male in no acute distress; appearance consistent with age of record HENT: normocephalic; atraumatic Eyes: Normal appearance Neck: supple Heart: regular rate and rhythm Lungs: clear to auscultation bilaterally Abdomen: soft; nondistended; nontender; bowel sounds present GU: Tanner V male, circumcised; no urethral discharge noted Extremities: No deformity; full range of motion Neurologic: Awake, alert and oriented; motor function intact in all extremities and symmetric; no facial droop Skin: Warm and dry Psychiatric: Normal mood and affect   RESULTS  Summary of this visit's results, reviewed and interpreted by  myself:   EKG Interpretation  Date/Time:    Ventricular Rate:    PR Interval:    QRS Duration:   QT Interval:    QTC Calculation:   R Axis:     Text Interpretation:         Laboratory Studies: No results found for this or any previous visit (from the past 24 hour(s)). Imaging Studies: No results found.  ED COURSE and MDM  Nursing notes, initial and subsequent vitals signs, including pulse oximetry, reviewed and interpreted by myself.  Vitals:   08/29/22 0550  BP: 131/73  Pulse: 67  Resp: 16  Temp: 98.4 F (36.9 C)  TempSrc: Oral  SpO2: 98%   Medications  cefTRIAXone (ROCEPHIN) injection 500 mg (has no administration in time range)  azithromycin (ZITHROMAX) powder 1 g (has no administration in time range)   GC/chlamydia swab obtained and sent.  Will go ahead and treat for urethritis.  Even though he is gay he states he practices only insertive intercourse.  This makes him a candidate for Zithromax which I prefer due to compliance concerns.  PROCEDURES  Procedures   ED DIAGNOSES     ICD-10-CM   1. Urethritis  N34.2          Leiann Sporer, MD 08/29/22 1950    Shanon Rosser, MD 08/29/22 [phone]

## 2022-08-30 LAB — GC/CHLAMYDIA PROBE AMP (~~LOC~~) NOT AT ARMC
Chlamydia: NEGATIVE
Comment: NEGATIVE
Comment: NORMAL
Neisseria Gonorrhea: NEGATIVE

## 2022-10-22 IMAGING — CR DG ANKLE COMPLETE 3+V*R*
3 series · 3 of 3 positions shown · non-contrast
Comparison: None Available.

CLINICAL DATA: Twisted ankle yesterday with pain and swelling.

EXAM:
RIGHT ANKLE - COMPLETE 3+ VIEW

[t ankle joint ap right]
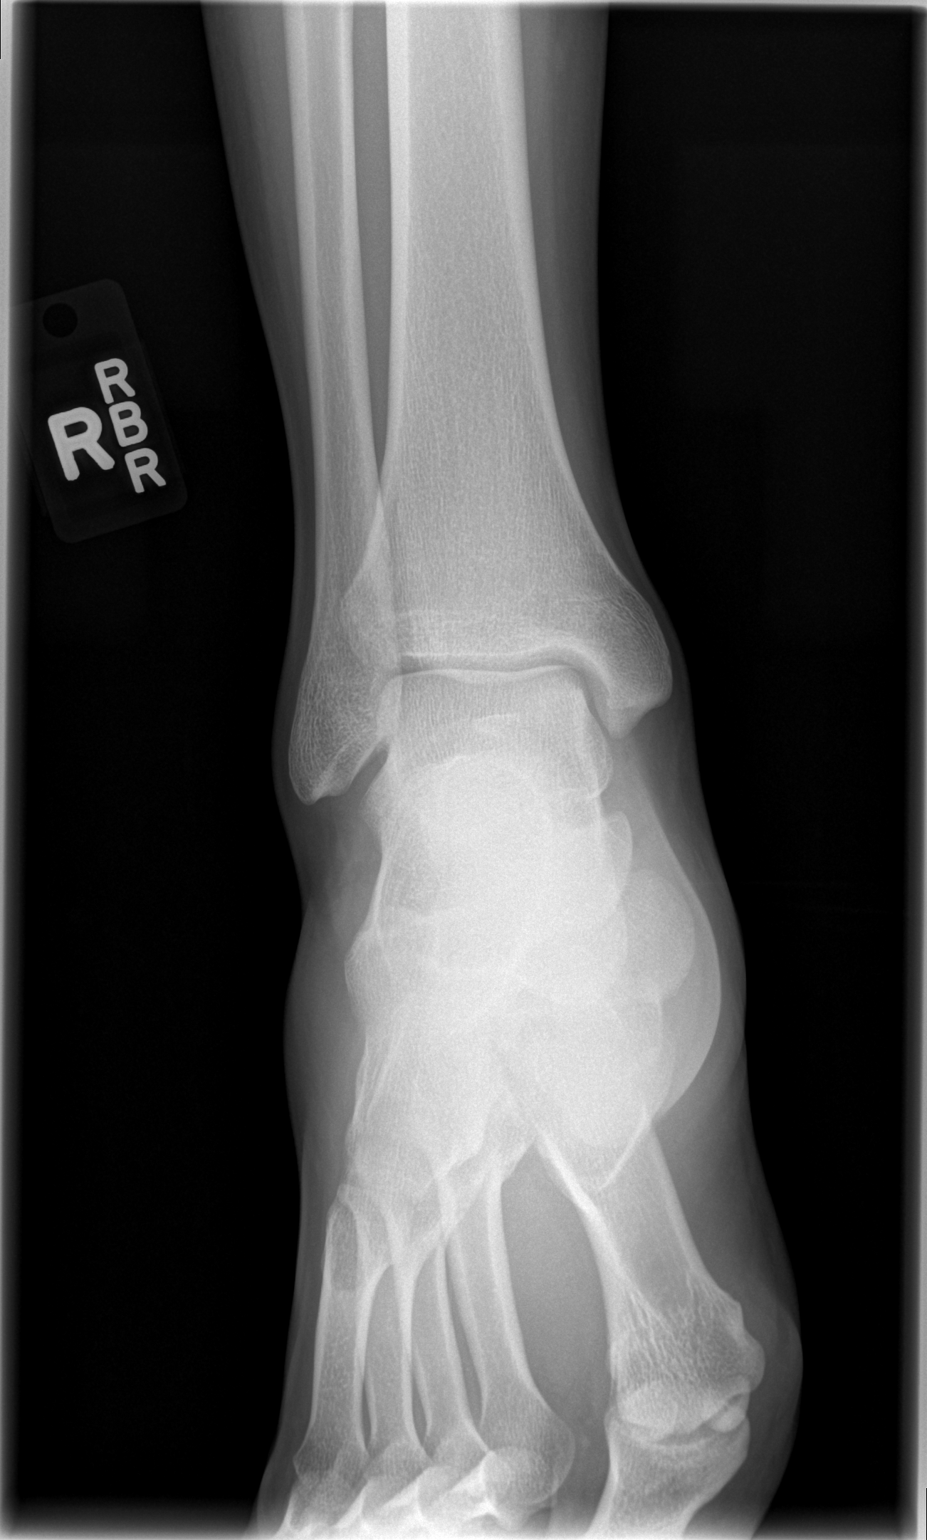

[t ankle joint oblique right]
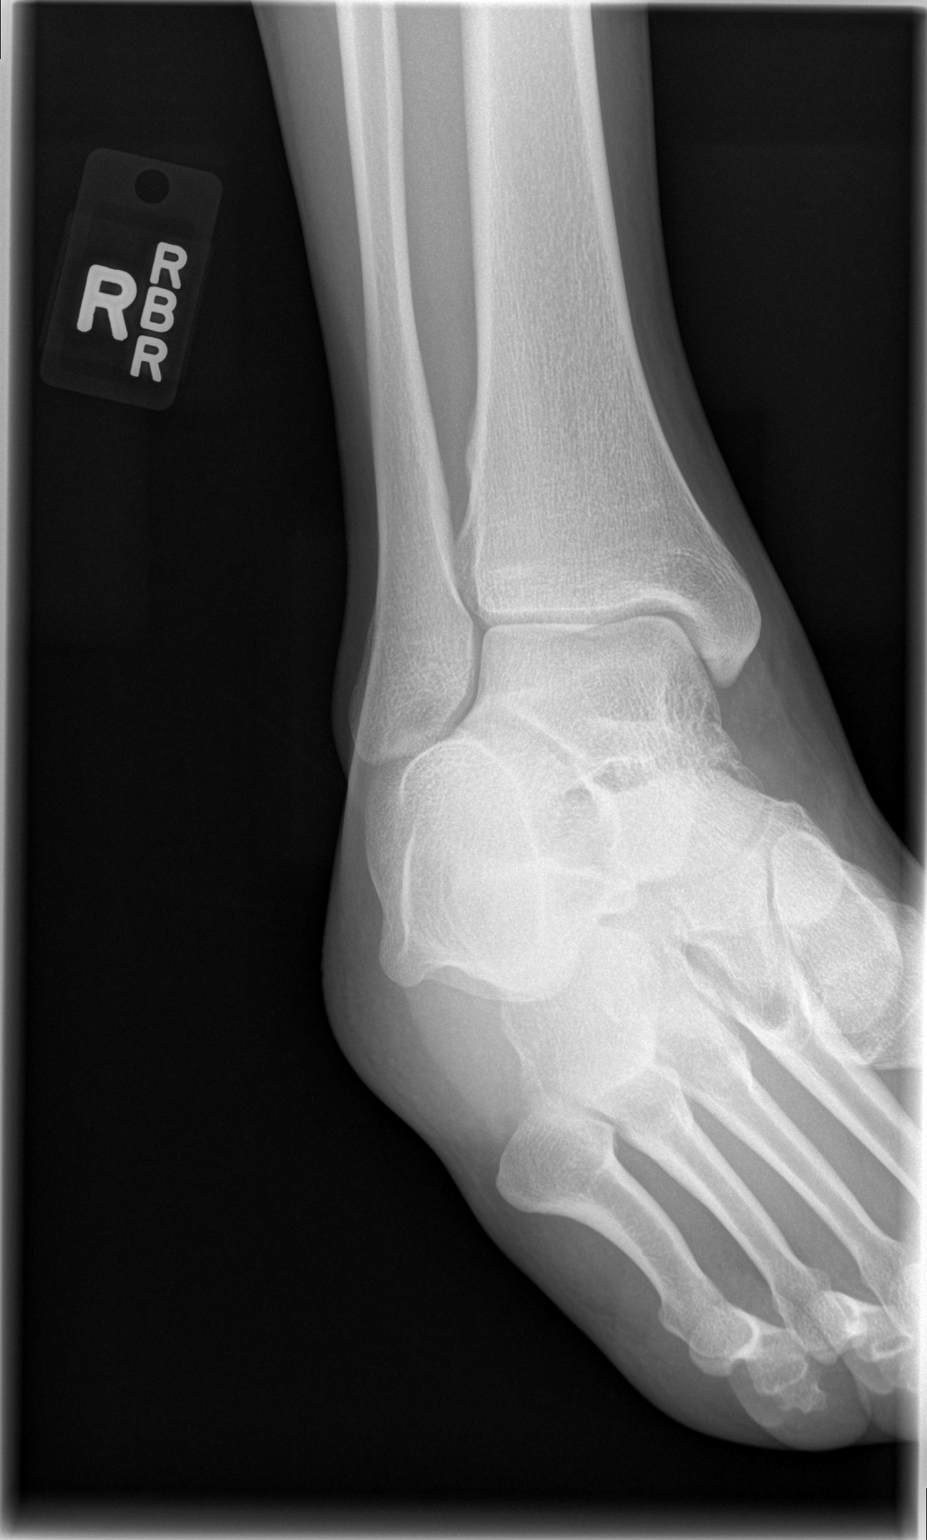

[t ankle joint lat right]
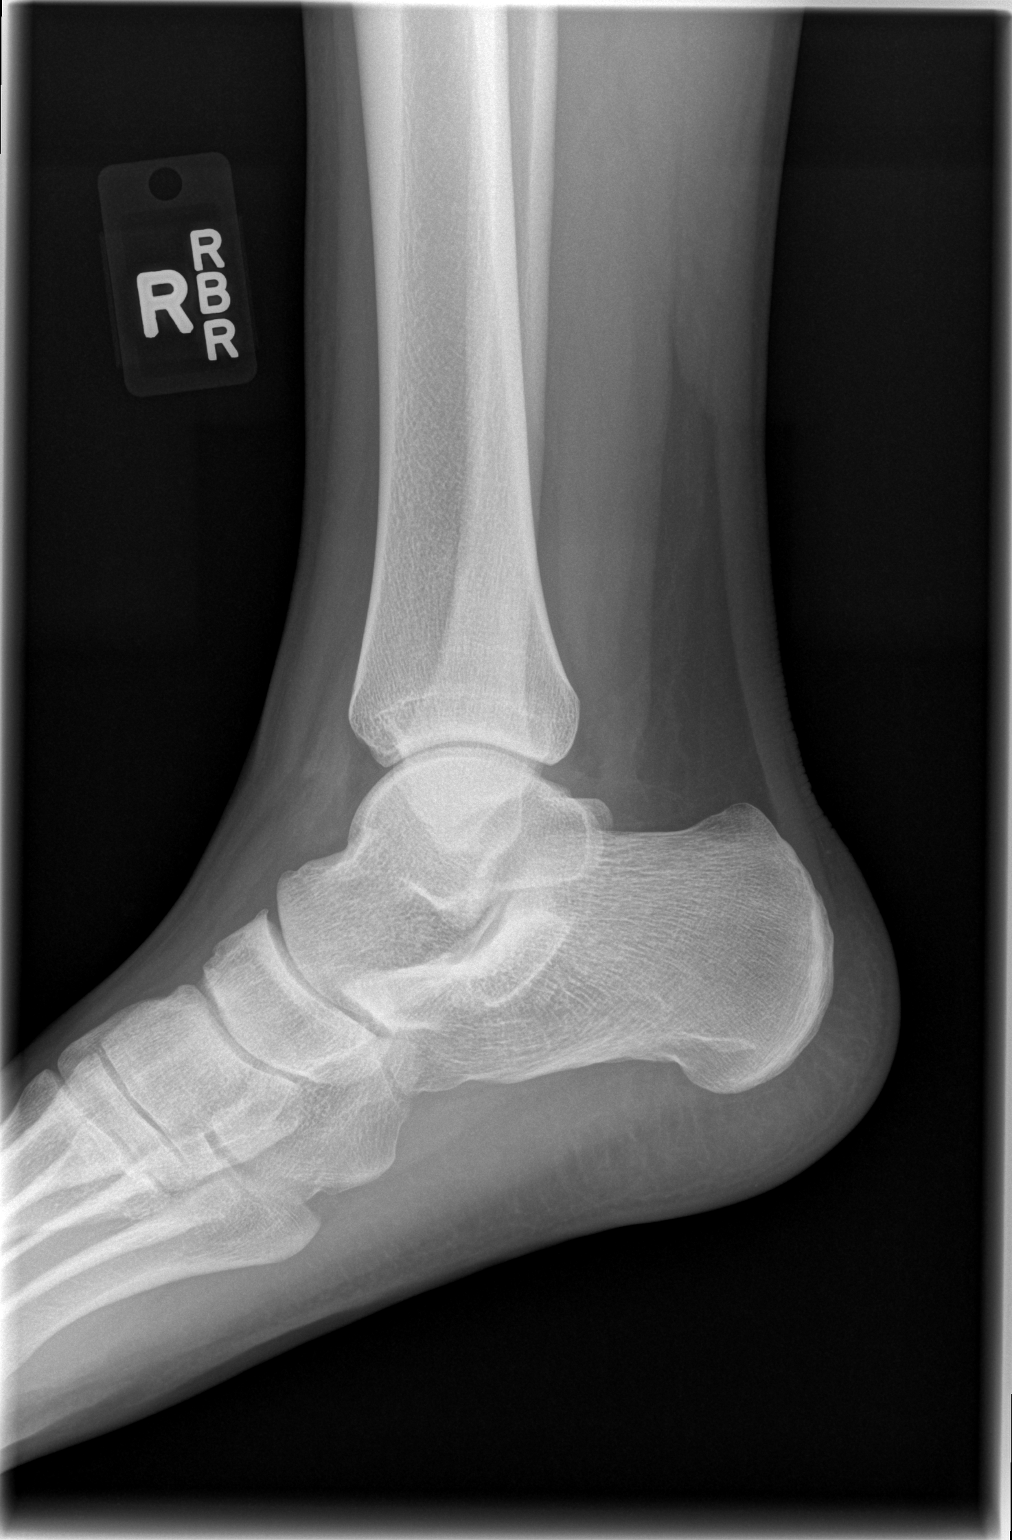

[3 of 3 positions shown; findings below may reference images not displayed]

FINDINGS: There is no evidence of fracture, dislocation, or joint effusion.
There is no evidence of arthropathy or other focal bone abnormality.
Soft tissues are unremarkable.
IMPRESSION: Negative.

## 2022-10-22 IMAGING — CR DG FOOT COMPLETE 3+V*R*
3 series · 3 of 3 positions shown · non-contrast
Comparison: None Available.

CLINICAL DATA: Twisted ankle yesterday with pain and swelling.

EXAM:
RIGHT FOOT COMPLETE - 3+ VIEW

[t foot lat right]
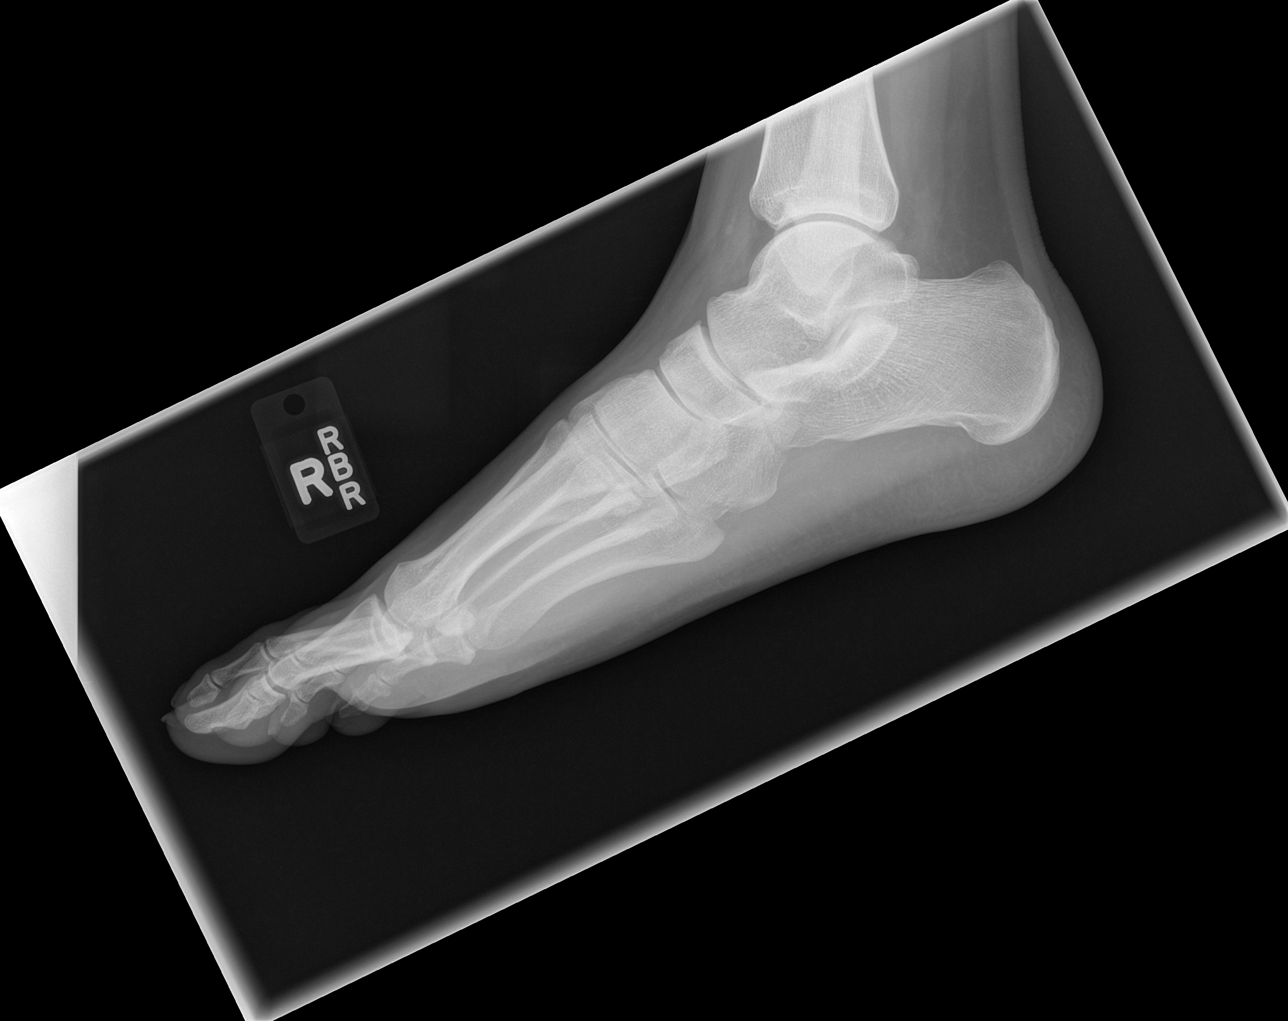

[t foot ap right]
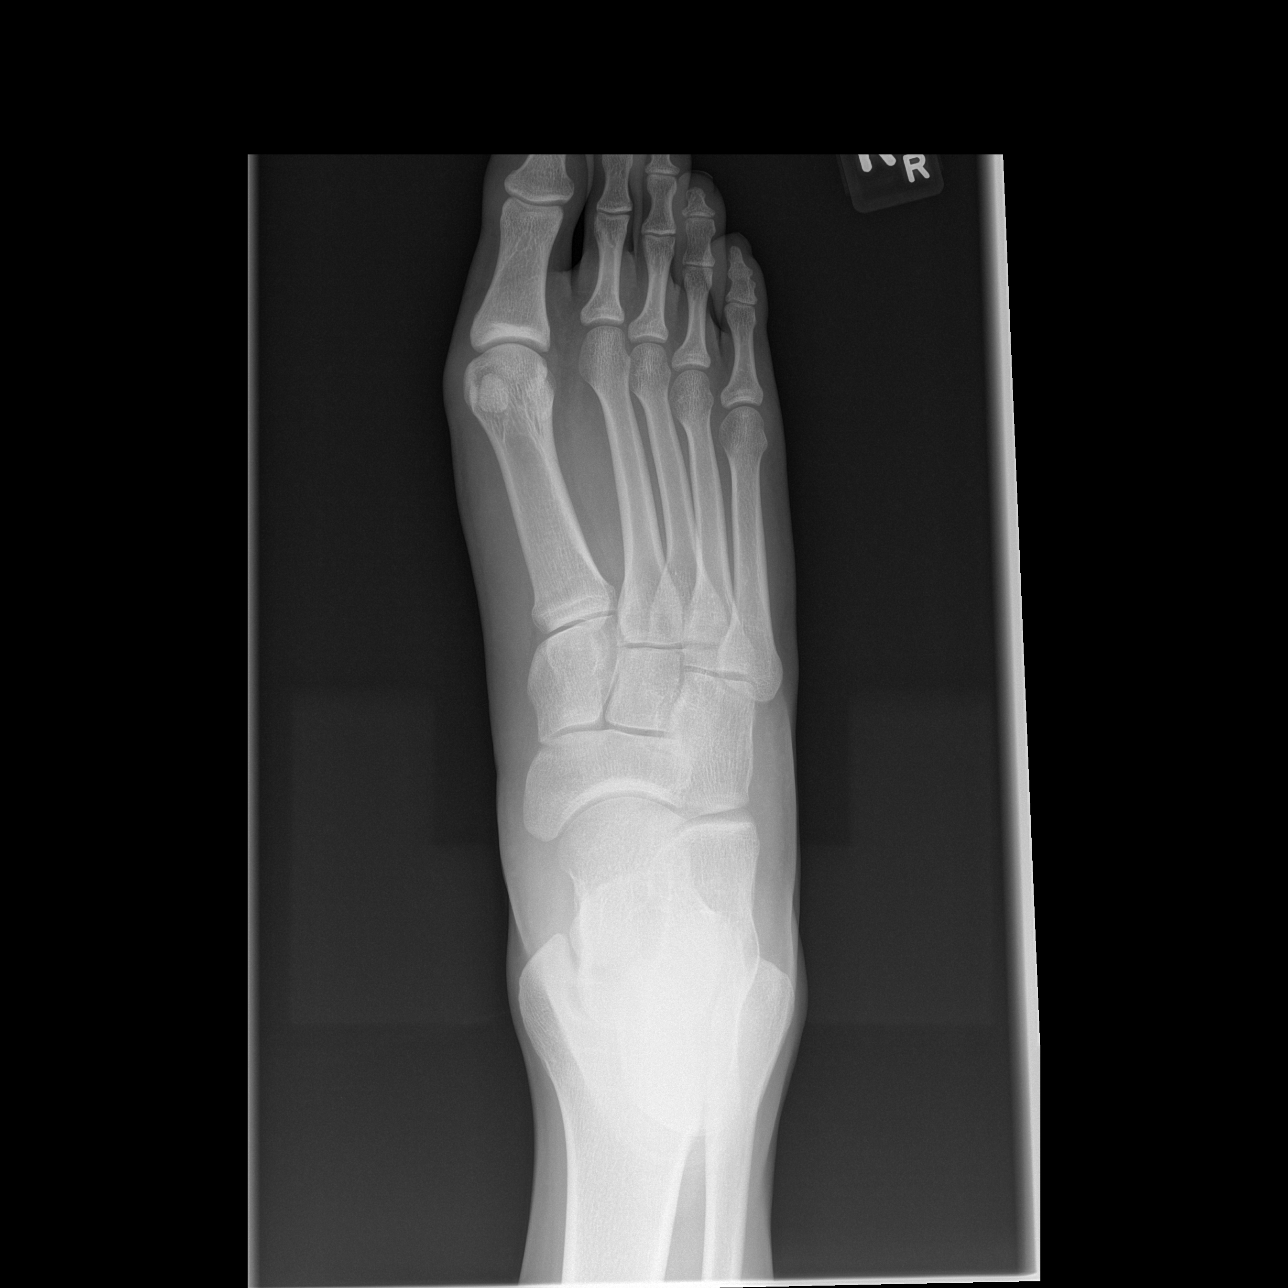

[t foot oblique right]
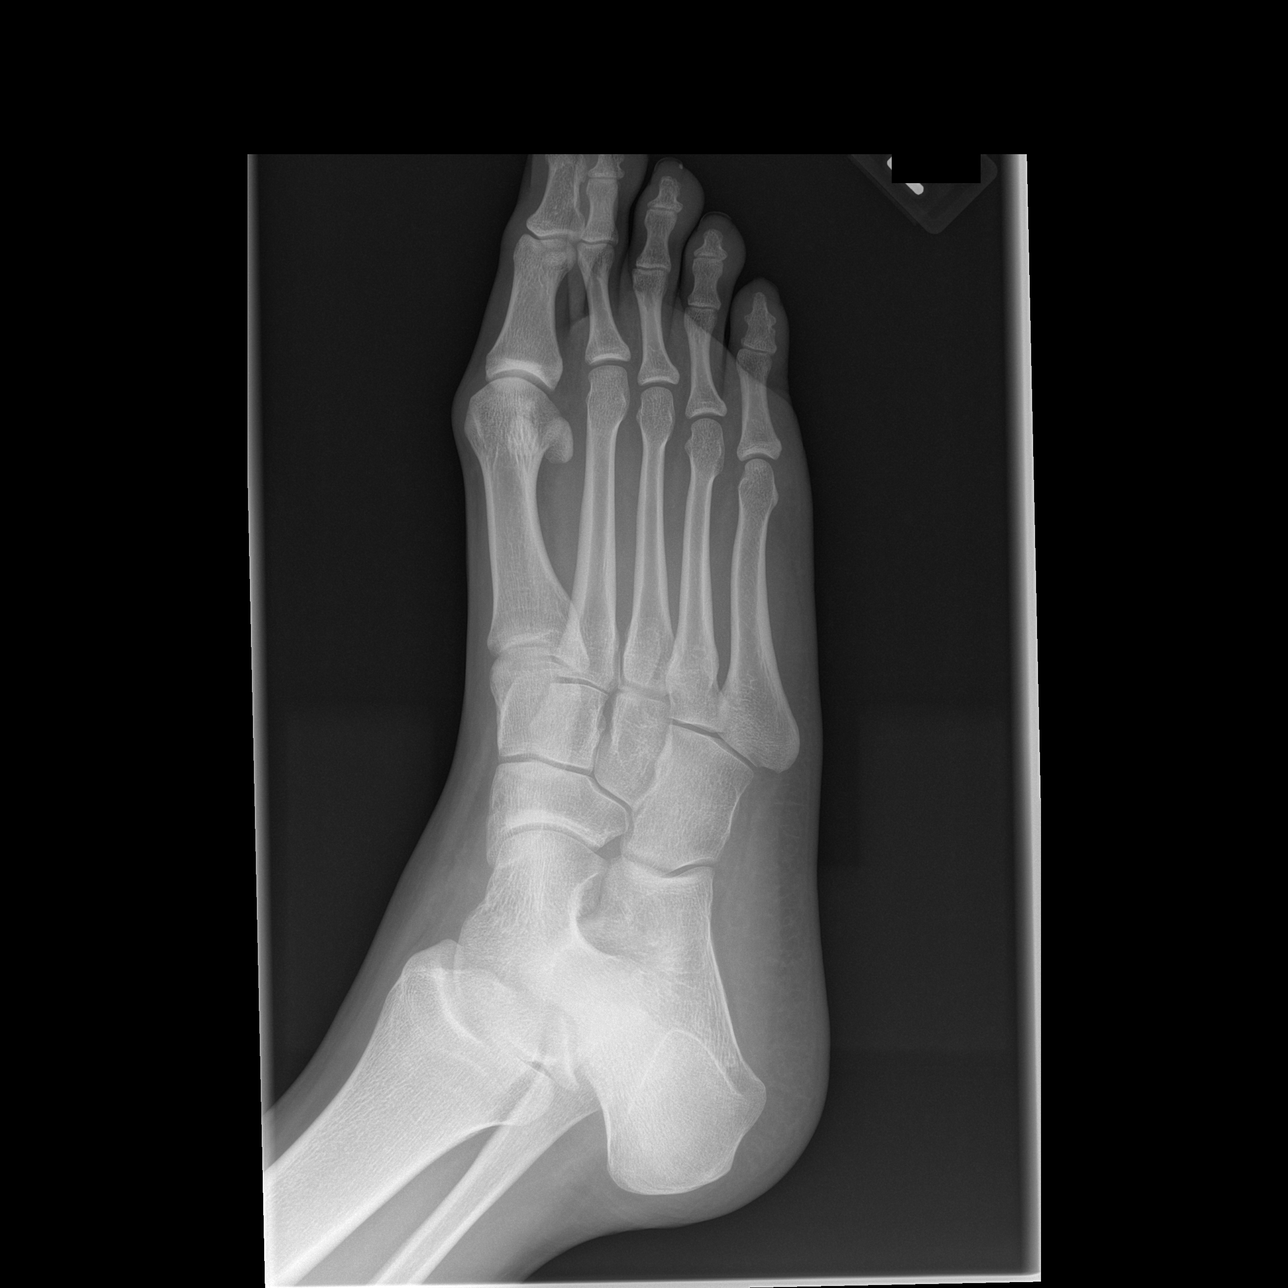

[3 of 3 positions shown; findings below may reference images not displayed]

FINDINGS: There is no evidence of fracture or dislocation. There is no
evidence of arthropathy or other focal bone abnormality. Soft
tissues are unremarkable.
IMPRESSION: Negative.

## 2023-01-01 ENCOUNTER — Encounter (HOSPITAL_BASED_OUTPATIENT_CLINIC_OR_DEPARTMENT_OTHER): Payer: Self-pay

## 2023-01-01 ENCOUNTER — Other Ambulatory Visit: Payer: Self-pay

## 2023-01-01 ENCOUNTER — Emergency Department (HOSPITAL_BASED_OUTPATIENT_CLINIC_OR_DEPARTMENT_OTHER)
Admission: EM | Admit: 2023-01-01 | Discharge: 2023-01-01 | Disposition: A | Discharge status: Home / Self Care | Payer: Self-pay | Attending: Emergency Medicine | Admitting: Emergency Medicine

## 2023-01-01 DIAGNOSIS — Z1152 Encounter for screening for COVID-19: Secondary | ICD-10-CM | POA: Insufficient documentation

## 2023-01-01 DIAGNOSIS — J45909 Unspecified asthma, uncomplicated: Secondary | ICD-10-CM | POA: Insufficient documentation

## 2023-01-01 DIAGNOSIS — J069 Acute upper respiratory infection, unspecified: Secondary | ICD-10-CM | POA: Insufficient documentation

## 2023-01-01 LAB — RESP PANEL BY RT-PCR (RSV, FLU A&B, COVID)  RVPGX2
Influenza A by PCR: NEGATIVE
Influenza B by PCR: NEGATIVE
Resp Syncytial Virus by PCR: NEGATIVE
SARS Coronavirus 2 by RT PCR: NEGATIVE

## 2023-01-01 LAB — GROUP A STREP BY PCR: Group A Strep by PCR: NOT DETECTED

## 2023-01-01 NOTE — Discharge Instructions (Signed)
You have been seen today for your complaint of upper respiratory infection symptoms. Your lab work was negative for flu, COVID, RSV, strep. Follow up with: Your primary care doctor as needed Please seek immediate medical care if you develop any of the following symptoms: You have shortness of breath that gets worse. You have severe or persistent: Headache. Ear pain. Sinus pain. Chest pain. You have chronic lung disease along with any of the following: Making high-pitched whistling sounds when you breathe, most often when you breathe out (wheezing). Prolonged cough (more than 14 days). Coughing up blood. A change in your usual mucus. You have a stiff neck. You have changes in your: Vision. Hearing. Thinking. Mood. At this time there does not appear to be the presence of an emergent medical condition, however there is always the potential for conditions to change. Please read and follow the below instructions.  Do not take your medicine if  develop an itchy rash, swelling in your mouth or lips, or difficulty breathing; call 911 and seek immediate emergency medical attention if this occurs.  You may review your lab tests and imaging results in their entirety on your MyChart account.  Please discuss all results of fully with your primary care provider and other specialist at your follow-up visit.  Note: Portions of this text may have been transcribed using voice recognition software. Every effort was made to ensure accuracy; however, inadvertent computerized transcription errors may still be present.

## 2023-01-01 NOTE — ED Triage Notes (Signed)
Pt arrives with c/o sore throat and nausea that started 2 days ago. Pt denies congestion, fevers, or cough. Pt requesting a work note.

## 2023-01-01 NOTE — ED Provider Notes (Signed)
[place] [facility] Provider Note   CSN: [id number] Arrival date & time: 01/01/23  2109     History  Chief Complaint  Patient presents with   Sore Throat    Zamier Messerli is a 22 y.o. male.  With a history of asthma who presents to the ED for evaluation of cough, congestion, sore throat.  Symptoms began 2 days ago.  They have progressively gotten better.  He called out of work today and was told that he cannot come back until he gets a work note from a provider.  Denies any chest pain or shortness of breath.  No fevers or chills.  States he typically gets upper respiratory infections during the summer.  Cough is nonproductive.   Sore Throat       Home Medications Prior to Admission medications   Not on File      Allergies    Patient has no known allergies.    Review of Systems   Review of Systems  HENT:  Positive for congestion.   Respiratory:  Positive for cough.   All other systems reviewed and are negative.   Physical Exam Updated Vital Signs BP (!) 143/70 (BP Location: Left Arm)   Pulse (!) 54   Temp 98 F (36.7 C)   Resp 18   Wt 79.4 kg   SpO2 97%   BMI 23.09 kg/m  Physical Exam Vitals and nursing note reviewed.  Constitutional:      General: He is not in acute distress.    Appearance: Normal appearance. He is normal weight. He is not ill-appearing.  HENT:     Head: Normocephalic and atraumatic.     Right Ear: Tympanic membrane normal.     Left Ear: Tympanic membrane and ear canal normal.     Nose: Congestion present. No rhinorrhea.     Mouth/Throat:     Mouth: Mucous membranes are moist.     Pharynx: Oropharynx is clear. Uvula midline. No oropharyngeal exudate.  Pulmonary:     Effort: Pulmonary effort is normal. No respiratory distress.  Abdominal:     General: Abdomen is flat.  Musculoskeletal:        General: Normal range of motion.     Cervical back: Neck supple.  Skin:    General: Skin is warm  and dry.  Neurological:     Mental Status: He is alert and oriented to person, place, and time.  Psychiatric:        Mood and Affect: Mood normal.        Behavior: Behavior normal.     ED Results / Procedures / Treatments   Labs (all labs ordered are listed, but only abnormal results are displayed) Labs Reviewed  GROUP A STREP BY PCR  RESP PANEL BY RT-PCR (RSV, FLU A&B, COVID)  [id number]    EKG None  Radiology No results found.  Procedures Procedures    Medications Ordered in ED Medications - No data to display  ED Course/ Medical Decision Making/ A&P                             Medical Decision Making This patient presents to the ED for concern of URI symptoms, this involves an extensive number of treatment options, and is a complaint that carries with it a high risk of complications and morbidity.  The differential diagnosis includes flu, COVID, RSV, other viral URI  My initial  workup includes respiratory panel, strep  Additional history obtained from: Nursing notes from this visit.  I ordered, reviewed and interpreted labs which include: Strep, strep.  Negative.  Afebrile, hemodynamically stable.  22 year old male presenting to the ED for evaluation of URI symptoms.  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  Symptoms appear mild.  Appears very well on physical exam.  Patient is able to return to work tomorrow.  Work note was given.  He was educated on symptomatic treatment.  Stable at discharge.  At this time there does not appear to be any evidence of an acute emergency medical condition and the patient appears stable for discharge with appropriate outpatient follow up. Diagnosis was discussed with patient who verbalizes understanding of care plan and is agreeable to discharge. I have discussed return precautions with patient who verbalizes understanding. Patient encouraged to follow-up with their PCP within 1 week. All questions  answered.  Note: Portions of this report may have been transcribed using voice recognition software. Every effort was made to ensure accuracy; however, inadvertent computerized transcription errors may still be present.        Final Clinical Impression(s) / ED Diagnoses Final diagnoses:  Viral URI with cough    Rx / DC Orders ED Discharge Orders     None         Michelle Piper, Cordelia Poche 01/01/23 2302    Virgina Norfolk, DO 01/01/23 2319

## 2023-01-11 ENCOUNTER — Other Ambulatory Visit: Payer: Self-pay

## 2023-01-11 ENCOUNTER — Emergency Department (HOSPITAL_BASED_OUTPATIENT_CLINIC_OR_DEPARTMENT_OTHER)
Admission: EM | Admit: 2023-01-11 | Discharge: 2023-01-11 | Disposition: A | Discharge status: Home / Self Care | Payer: Self-pay | Attending: Emergency Medicine | Admitting: Emergency Medicine

## 2023-01-11 ENCOUNTER — Encounter (HOSPITAL_BASED_OUTPATIENT_CLINIC_OR_DEPARTMENT_OTHER): Payer: Self-pay | Admitting: Emergency Medicine

## 2023-01-11 DIAGNOSIS — Z202 Contact with and (suspected) exposure to infections with a predominantly sexual mode of transmission: Secondary | ICD-10-CM | POA: Insufficient documentation

## 2023-01-11 DIAGNOSIS — Z711 Person with feared health complaint in whom no diagnosis is made: Secondary | ICD-10-CM

## 2023-01-11 LAB — HIV ANTIBODY (ROUTINE TESTING W REFLEX): HIV Screen 4th Generation wRfx: NONREACTIVE

## 2023-01-11 NOTE — ED Triage Notes (Signed)
Potential exposure to STD.  Pt is asymptomatic but just wants to be checked for STD's.

## 2023-01-11 NOTE — ED Provider Notes (Signed)
[place] [facility] Provider Note   CSN: [id number] Arrival date & time: 01/11/23  1126     History  Chief Complaint  Patient presents with   Exposure to STD    Dennis Klein is a 22 y.o. male.  Dennis Klein is a 22 y.o. male who presents to the ED requesting STD check.  Patient reports they are sexually active with 1 male partner.  Patient reports that he noticed a small red bump above the lip after shaving and they were worried it could be herpes.  Bump is not painful.  They report that their partner does not have herpes that they are aware of and they have not seen any similar bumps or lesions on their partner.  Patient denies any penile discharge, dysuria, testicular pain or swelling.  No rash, no other complaints.  The history is provided by the patient.  Exposure to STD       Home Medications Prior to Admission medications   Not on File      Allergies    Patient has no known allergies.    Review of Systems   Review of Systems  Constitutional:  Negative for chills and fever.  Genitourinary:  Negative for dysuria, frequency, genital sores, penile discharge, penile pain, penile swelling, scrotal swelling and testicular pain.  All other systems reviewed and are negative.   Physical Exam Updated Vital Signs BP 130/77 (BP Location: Left Arm)   Pulse 74   Temp 98.8 F (37.1 C) (Oral)   Resp 16   SpO2 98%  Physical Exam Vitals and nursing note reviewed.  Constitutional:      General: He is not in acute distress.    Appearance: Normal appearance. He is well-developed. He is not ill-appearing or diaphoretic.  HENT:     Head: Normocephalic and atraumatic.     Mouth/Throat:     Comments: There is a tiny red spot overlying follicle just above the upper lip, no vesicular lesions, red bump is nontender to palpation, no other lesions noted.  No intraoral lesions. Eyes:     General:        Right eye: No discharge.         Left eye: No discharge.  Pulmonary:     Effort: Pulmonary effort is normal. No respiratory distress.  Abdominal:     Tenderness: There is no abdominal tenderness.  Genitourinary:    Comments: Chaperone present  No inguinal lymphadenopathy or genital lesions. Penis normal without discharge noted, testicles nontender without swelling or masses. Skin:    General: Skin is warm and dry.  Neurological:     Mental Status: He is alert and oriented to person, place, and time.     Coordination: Coordination normal.  Psychiatric:        Mood and Affect: Mood normal.        Behavior: Behavior normal.     ED Results / Procedures / Treatments   Labs (all labs ordered are listed, but only abnormal results are displayed) Labs Reviewed  RPR  HIV ANTIBODY (ROUTINE TESTING W REFLEX)  GC/CHLAMYDIA PROBE AMP ([place]) NOT AT [facility]    EKG None  Radiology No results found.  Procedures Procedures    Medications Ordered in ED Medications - No data to display  ED Course/ Medical Decision Making/ A&P  Medical Decision Making  22 year old male presents requesting STI screening, he is currently asymptomatic.  Noticed a small red spot above his upper lip after shaving, has no prior history of herpes and partner does not have herpes that he is aware of.  Lesion does not appear consistent with herpes it is nonpainful and nonvesicular.  No other lesions noted.  STI testing sent and patient is aware that they will be called in 2 to 3 days with any positive results and will need to inform partners of any positive test so that they can be tested and treated as well.  Encourage patient to seek further routine STI testing at the health department in the future.        Final Clinical Impression(s) / ED Diagnoses Final diagnoses:  Concern about STD in male without diagnosis    Rx / DC Orders ED Discharge Orders     None         Dennis Klein,  [place] 01/11/23 1545    Dennis Sleeper, MD 01/12/23 [phone]

## 2023-01-11 NOTE — Discharge Instructions (Signed)
You were tested today for sexually transmitted infections and will be called by phone if any of these results are positive.  If you have any positive results you will need to let your partner know so they can be tested and treated as well.  You can follow-up with the health department for any future STI testing needs.

## 2023-01-12 LAB — RPR: RPR Ser Ql: NONREACTIVE

## 2023-01-12 LAB — GC/CHLAMYDIA PROBE AMP (~~LOC~~) NOT AT ARMC
Chlamydia: NEGATIVE
Comment: NEGATIVE
Comment: NORMAL
Neisseria Gonorrhea: NEGATIVE

## 2023-03-05 ENCOUNTER — Encounter (HOSPITAL_BASED_OUTPATIENT_CLINIC_OR_DEPARTMENT_OTHER): Payer: Self-pay

## 2023-03-05 ENCOUNTER — Other Ambulatory Visit: Payer: Self-pay

## 2023-03-05 ENCOUNTER — Emergency Department (HOSPITAL_BASED_OUTPATIENT_CLINIC_OR_DEPARTMENT_OTHER)
Admission: EM | Admit: 2023-03-05 | Discharge: 2023-03-05 | Disposition: A | Discharge status: Home / Self Care | Payer: Self-pay | Attending: Emergency Medicine | Admitting: Emergency Medicine

## 2023-03-05 ENCOUNTER — Other Ambulatory Visit (HOSPITAL_BASED_OUTPATIENT_CLINIC_OR_DEPARTMENT_OTHER): Payer: Self-pay

## 2023-03-05 DIAGNOSIS — R112 Nausea with vomiting, unspecified: Secondary | ICD-10-CM | POA: Insufficient documentation

## 2023-03-05 DIAGNOSIS — R109 Unspecified abdominal pain: Secondary | ICD-10-CM | POA: Insufficient documentation

## 2023-03-05 MED ORDER — ONDANSETRON 4 MG PO TBDP
4.0000 mg | ORAL_TABLET | Freq: Once | ORAL | Status: AC
  Administered 2023-03-05: 4 mg via ORAL
  Filled 2023-03-05: qty 1

## 2023-03-05 MED ORDER — ONDANSETRON 4 MG PO TBDP
4.0000 mg | ORAL_TABLET | Freq: Three times a day (TID) | ORAL | 0 refills | Status: AC | PRN
  Filled 2023-03-05: qty 20, 7d supply, fill #0

## 2023-03-05 NOTE — ED Provider Notes (Signed)
[name] [facility] Provider Note   CSN: [id number] Arrival date & time: 03/05/23  0757     History  Chief Complaint  Patient presents with   Abdominal Pain    Dennis Klein is a 22 y.o. male.  Patient here with food poisoning type symptoms he states.  Ate something funny last night that made him throw up twice.  He has some abdominal cramping feeling much better this morning but does not feel like he can go to work.  He was able to drink some fluids this morning.  He is not having a lot of abdominal pain now.  Nausea is better.  Has not vomited since last night.  Denies any diarrhea.  No surgery history.  Denies any alcohol use.  The history is provided by the patient.       Home Medications Prior to Admission medications   Medication Sig Start Date End Date Taking? Authorizing Provider  ondansetron (ZOFRAN-ODT) 4 MG disintegrating tablet Take 1 tablet (4 mg total) by mouth every 8 (eight) hours as needed. 03/05/23  Yes Deshawna Mcneece, DO      Allergies    Patient has no known allergies.    Review of Systems   Review of Systems  Physical Exam Updated Vital Signs BP 125/69   Pulse (!) 55   Temp 98.7 F (37.1 C) (Oral)   Resp 18   SpO2 98%  Physical Exam Vitals and nursing note reviewed.  Constitutional:      General: He is not in acute distress.    Appearance: He is well-developed. He is not ill-appearing.  HENT:     Head: Normocephalic and atraumatic.     Mouth/Throat:     Mouth: Mucous membranes are moist.  Eyes:     Extraocular Movements: Extraocular movements intact.     Conjunctiva/sclera: Conjunctivae normal.     Pupils: Pupils are equal, round, and reactive to light.  Cardiovascular:     Rate and Rhythm: Normal rate and regular rhythm.     Heart sounds: Normal heart sounds. No murmur heard. Pulmonary:     Effort: Pulmonary effort is normal. No respiratory distress.     Breath sounds: Normal breath sounds.   Abdominal:     Palpations: Abdomen is soft.     Tenderness: There is no abdominal tenderness.  Musculoskeletal:        General: No swelling.     Cervical back: Neck supple.  Skin:    General: Skin is warm and dry.     Capillary Refill: Capillary refill takes less than 2 seconds.  Neurological:     Mental Status: He is alert.  Psychiatric:        Mood and Affect: Mood normal.     ED Results / Procedures / Treatments   Labs (all labs ordered are listed, but only abnormal results are displayed) Labs Reviewed - No data to display  EKG None  Radiology No results found.  Procedures Procedures    Medications Ordered in ED Medications  ondansetron (ZOFRAN-ODT) disintegrating tablet 4 mg (has no administration in time range)    ED Course/ Medical Decision Making/ A&P                                 Medical Decision Making Risk Prescription drug management.   Dennis Klein is here with nausea and vomiting.  Suspects he has food poisoning.  He is feeling a lot better this morning but did not feel like go to work.  Normal vitals.  No fever.  No significant medical history.  Abdominal exam is benign.  He is very well-appearing.  He has been able to tolerate fluids this morning.  Little bit of nausea still but no diarrhea.  I have no concern for appendicitis or other acute abdominal process at this time.  He is ambulatory without any issues.  Will give him Zofran.  I do suspect that he has some mild food poisoning.  Work note was provided.  He understands return precautions.  Discharged in good condition.  This chart was dictated using voice recognition software.  Despite best efforts to proofread,  errors can occur which can change the documentation meaning.         Final Clinical Impression(s) / ED Diagnoses Final diagnoses:  Nausea and vomiting, unspecified vomiting type  Abdominal cramping    Rx / DC Orders ED Discharge Orders          Ordered     ondansetron (ZOFRAN-ODT) 4 MG disintegrating tablet  Every 8 hours PRN        03/05/23 0810              Virgina Norfolk, DO 03/05/23 4098

## 2023-03-05 NOTE — ED Triage Notes (Addendum)
Abdominal pain/cramping  started last night. Nausea better this am. Some heartburn

## 2023-03-05 NOTE — Discharge Instructions (Signed)
Take Zofran as for nausea.  Return if symptoms worsen as discussed.  I do suspect you have some food poisoning.

## 2023-03-20 ENCOUNTER — Other Ambulatory Visit (HOSPITAL_BASED_OUTPATIENT_CLINIC_OR_DEPARTMENT_OTHER): Payer: Self-pay

## 2023-03-21 ENCOUNTER — Encounter (HOSPITAL_BASED_OUTPATIENT_CLINIC_OR_DEPARTMENT_OTHER): Payer: Self-pay

## 2023-03-21 ENCOUNTER — Emergency Department (HOSPITAL_BASED_OUTPATIENT_CLINIC_OR_DEPARTMENT_OTHER)
Admission: EM | Admit: 2023-03-21 | Discharge: 2023-03-21 | Disposition: A | Discharge status: Home / Self Care | Payer: Self-pay | Attending: Emergency Medicine | Admitting: Emergency Medicine

## 2023-03-21 ENCOUNTER — Other Ambulatory Visit: Payer: Self-pay

## 2023-03-21 DIAGNOSIS — R1013 Epigastric pain: Secondary | ICD-10-CM | POA: Insufficient documentation

## 2023-03-21 LAB — CBC WITH DIFFERENTIAL/PLATELET
Abs Immature Granulocytes: 0.02 10*3/uL (ref 0.00–0.07)
Basophils Absolute: 0.1 10*3/uL (ref 0.0–0.1)
Basophils Relative: 2 %
Eosinophils Absolute: 0.3 10*3/uL (ref 0.0–0.5)
Eosinophils Relative: 4 %
HCT: 42.3 % (ref 39.0–52.0)
Hemoglobin: 14 g/dL (ref 13.0–17.0)
Immature Granulocytes: 0 %
Lymphocytes Relative: 43 %
Lymphs Abs: 3 10*3/uL (ref 0.7–4.0)
MCH: 29.8 pg (ref 26.0–34.0)
MCHC: 33.1 g/dL (ref 30.0–36.0)
MCV: 90 fL (ref 80.0–100.0)
Monocytes Absolute: 0.4 10*3/uL (ref 0.1–1.0)
Monocytes Relative: 6 %
Neutro Abs: 3 10*3/uL (ref 1.7–7.7)
Neutrophils Relative %: 45 %
Platelets: 394 10*3/uL (ref 150–400)
RBC: 4.7 MIL/uL (ref 4.22–5.81)
RDW: 11.4 % — ABNORMAL LOW (ref 11.5–15.5)
WBC: 6.8 10*3/uL (ref 4.0–10.5)
nRBC: 0 % (ref 0.0–0.2)

## 2023-03-21 LAB — URINALYSIS, ROUTINE W REFLEX MICROSCOPIC
Bilirubin Urine: NEGATIVE
Glucose, UA: NEGATIVE mg/dL
Hgb urine dipstick: NEGATIVE
Ketones, ur: NEGATIVE mg/dL
Leukocytes,Ua: NEGATIVE
Nitrite: NEGATIVE
Protein, ur: NEGATIVE mg/dL
Specific Gravity, Urine: 1.02 (ref 1.005–1.030)
pH: 6.5 (ref 5.0–8.0)

## 2023-03-21 LAB — BASIC METABOLIC PANEL
Anion gap: 10 (ref 5–15)
BUN: 13 mg/dL (ref 6–20)
CO2: 24 mmol/L (ref 22–32)
Calcium: 8.9 mg/dL (ref 8.9–10.3)
Chloride: 101 mmol/L (ref 98–111)
Creatinine, Ser: 0.92 mg/dL (ref 0.61–1.24)
GFR, Estimated: >60 mL/min (ref >60)
Glucose, Bld: 93 mg/dL (ref 70–99)
Potassium: 3.7 mmol/L (ref 3.5–5.1)
Sodium: 135 mmol/L (ref 135–145)

## 2023-03-21 MED ORDER — SUCRALFATE 1 G PO TABS: 1.0000 g | ORAL_TABLET | Freq: Three times a day (TID) | ORAL | 0 refills | Status: AC

## 2023-03-21 MED ORDER — ALUM & MAG HYDROXIDE-SIMETH 200-200-20 MG/5ML PO SUSP
30.0000 mL | Freq: Once | ORAL | Status: AC
  Administered 2023-03-21: 30 mL via ORAL
  Filled 2023-03-21: qty 30

## 2023-03-21 MED ORDER — PANTOPRAZOLE SODIUM 20 MG PO TBEC: 20.0000 mg | DELAYED_RELEASE_TABLET | Freq: Every day | ORAL | 0 refills | Status: AC

## 2023-03-21 NOTE — ED Provider Notes (Signed)
[place] [facility] Provider Note   CSN: [id number] Arrival date & time: 03/21/23  2057     History  Chief Complaint  Patient presents with   Abdominal Pain    Dennis Klein is a 22 y.o. male.  Patient here with upper abdominal discomfort, sour taste in his mouth.  Feels like he has some reflux.  Happened after eating some food tonight.  Usually happens after eat steak.  Does not have any difficulty eating or drinking otherwise.  No severe abdominal pain.  Denies any fever or chills.  Nothing makes it worse or better.  No medical problems.  The history is provided by the patient.       Home Medications Prior to Admission medications   Medication Sig Start Date End Date Taking? Authorizing Provider  pantoprazole (PROTONIX) 20 MG tablet Take 1 tablet (20 mg total) by mouth daily for 14 days. 03/21/23 04/04/23 Yes Risa Auman, DO  sucralfate (CARAFATE) 1 g tablet Take 1 tablet (1 g total) by mouth 4 (four) times daily -  with meals and at bedtime for 14 days. 03/21/23 04/04/23 Yes Cheveyo Virginia, DO  ondansetron (ZOFRAN-ODT) 4 MG disintegrating tablet Take 1 tablet (4 mg total) by mouth every 8 (eight) hours as needed. 03/05/23   Virgina Norfolk, DO      Allergies    Patient has no known allergies.    Review of Systems   Review of Systems  Physical Exam Updated Vital Signs BP 129/71 (BP Location: Left Arm)   Pulse 63   Temp 98.2 F (36.8 C)   Resp 18   Ht 6\' 1"  (1.854 m)   Wt 79.4 kg   SpO2 100%   BMI 23.09 kg/m  Physical Exam Vitals and nursing note reviewed.  Constitutional:      General: He is not in acute distress.    Appearance: He is well-developed. He is not ill-appearing.  HENT:     Head: Normocephalic and atraumatic.     Mouth/Throat:     Mouth: Mucous membranes are moist.  Eyes:     Extraocular Movements: Extraocular movements intact.     Conjunctiva/sclera: Conjunctivae normal.     Pupils: Pupils are equal,  round, and reactive to light.  Cardiovascular:     Rate and Rhythm: Normal rate and regular rhythm.     Heart sounds: Normal heart sounds. No murmur heard. Pulmonary:     Effort: Pulmonary effort is normal. No respiratory distress.     Breath sounds: Normal breath sounds.  Abdominal:     Palpations: Abdomen is soft.     Tenderness: There is no abdominal tenderness.  Musculoskeletal:        General: No swelling.     Cervical back: Neck supple.  Skin:    General: Skin is warm and dry.     Capillary Refill: Capillary refill takes less than 2 seconds.  Neurological:     General: No focal deficit present.     Mental Status: He is alert.  Psychiatric:        Mood and Affect: Mood normal.     ED Results / Procedures / Treatments   Labs (all labs ordered are listed, but only abnormal results are displayed) Labs Reviewed  CBC WITH DIFFERENTIAL/PLATELET - Abnormal; Notable for the following components:      Result Value   RDW 11.4 (*)    All other components within normal limits  BASIC METABOLIC PANEL  URINALYSIS, ROUTINE  W REFLEX MICROSCOPIC    EKG None  Radiology No results found.  Procedures Procedures    Medications Ordered in ED Medications  alum & mag hydroxide-simeth (MAALOX/MYLANTA) 200-200-20 MG/5ML suspension 30 mL (30 mLs Oral Given 03/21/23 2135)    ED Course/ Medical Decision Making/ A&P                                 Medical Decision Making Amount and/or Complexity of Data Reviewed Labs: ordered.  Risk OTC drugs. Prescription drug management.   Dennis Klein is here with some upper abdominal discomfort, sour taste in his mouth.  Differential diagnosis likely acid reflux.  I have very little concern for intra-abdominal process at this time.  He already had basic labs done prior to my evaluation with a CBC BMP and urinalysis.  Ultimately I will think patient needs any further labs.  I do not think he has pancreatitis or cholecystitis or hepatitis.   He is not having any abdominal pain.  There is no scleral icterus.  He is describing some mild reflux symptoms.  Overall the lab work that was performed is reassuring as well.  No urine infection.  Is not having any chest pain or shortness of breath or cough or sputum production.  Ultimately he was given Maalox with improvement.  I will prescribe Carafate and Protonix.  Gave him some information to read about acid reflux.  Will refer him to wellness center.  Discharged in good condition.  Understands return precautions.  This chart was dictated using voice recognition software.  Despite best efforts to proofread,  errors can occur which can change the documentation meaning.         Final Clinical Impression(s) / ED Diagnoses Final diagnoses:  Epigastric pain    Rx / DC Orders ED Discharge Orders          Ordered    pantoprazole (PROTONIX) 20 MG tablet  Daily        03/21/23 2144    sucralfate (CARAFATE) 1 g tablet  3 times daily with meals & bedtime        03/21/23 2144              Virgina Norfolk, DO 03/21/23 2146

## 2023-03-21 NOTE — ED Triage Notes (Addendum)
Pt c/o abdominal pain +nausea/vomiting "Stomach feels sour"

## 2023-07-26 ENCOUNTER — Encounter (HOSPITAL_BASED_OUTPATIENT_CLINIC_OR_DEPARTMENT_OTHER): Payer: Self-pay

## 2023-07-26 ENCOUNTER — Other Ambulatory Visit: Payer: Self-pay

## 2023-07-26 ENCOUNTER — Emergency Department (HOSPITAL_BASED_OUTPATIENT_CLINIC_OR_DEPARTMENT_OTHER)
Admission: EM | Admit: 2023-07-26 | Discharge: 2023-07-26 | Disposition: A | Discharge status: Home / Self Care | Payer: Self-pay | Attending: Emergency Medicine | Admitting: Emergency Medicine

## 2023-07-26 ENCOUNTER — Other Ambulatory Visit (HOSPITAL_BASED_OUTPATIENT_CLINIC_OR_DEPARTMENT_OTHER): Payer: Self-pay

## 2023-07-26 DIAGNOSIS — R369 Urethral discharge, unspecified: Secondary | ICD-10-CM | POA: Insufficient documentation

## 2023-07-26 DIAGNOSIS — Z202 Contact with and (suspected) exposure to infections with a predominantly sexual mode of transmission: Secondary | ICD-10-CM

## 2023-07-26 LAB — HIV ANTIBODY (ROUTINE TESTING W REFLEX): HIV Screen 4th Generation wRfx: NONREACTIVE

## 2023-07-26 LAB — RPR: RPR Ser Ql: NONREACTIVE

## 2023-07-26 MED ORDER — STERILE WATER FOR INJECTION IJ SOLN
INTRAMUSCULAR | Status: AC
  Administered 2023-07-26: 1 mL
  Filled 2023-07-26: qty 10

## 2023-07-26 MED ORDER — CEFTRIAXONE SODIUM 500 MG IJ SOLR
500.0000 mg | Freq: Once | INTRAMUSCULAR | Status: AC
  Administered 2023-07-26: 500 mg via INTRAMUSCULAR
  Filled 2023-07-26: qty 500

## 2023-07-26 MED ORDER — DOXYCYCLINE HYCLATE 100 MG PO CAPS
100.0000 mg | ORAL_CAPSULE | Freq: Two times a day (BID) | ORAL | 0 refills | Status: AC
  Filled 2023-07-26: qty 14, 7d supply, fill #0

## 2023-07-26 NOTE — ED Triage Notes (Signed)
 Pt states he is here to get tested for STDs, pt had unprotected sex.

## 2023-07-26 NOTE — Discharge Instructions (Addendum)
 Your results will be available in mychart in a few days.

## 2023-07-26 NOTE — ED Provider Notes (Signed)
 [place] [facility] Provider Note   CSN: [id number] Arrival date & time: 07/26/23  0741     History  Chief Complaint  Patient presents with   SEXUALLY TRANSMITTED DISEASE    Dennis Klein is a 23 y.o. male.  HPI      22yo who presents with concern for STI exposure.   Reports they received a call that someone they had been with had contact with chlamydia without protection. They were having unprotected intercourse.  Dennis Klein does not have skin lesions they are worried about.  Does have mild discharge. No abdominal pain, dysuria, fever, sore throat. Examines face and would like area on left lower lip and right upper lip checked.   Home Medications Prior to Admission medications   Medication Sig Start Date End Date Taking? Authorizing Provider  doxycycline  (VIBRAMYCIN ) 100 MG capsule Take 1 capsule (100 mg total) by mouth 2 (two) times daily for 7 days. 07/26/23 08/02/23 Yes Dreama Longs, MD  ondansetron  (ZOFRAN -ODT) 4 MG disintegrating tablet Take 1 tablet (4 mg total) by mouth every 8 (eight) hours as needed. 03/05/23   Curatolo, Adam, DO  pantoprazole  (PROTONIX ) 20 MG tablet Take 1 tablet (20 mg total) by mouth daily for 14 days. 03/21/23 04/04/23  Curatolo, Adam, DO  sucralfate  (CARAFATE ) 1 g tablet Take 1 tablet (1 g total) by mouth 4 (four) times daily -  with meals and at bedtime for 14 days. 03/21/23 04/04/23  Ruthe Cornet, DO      Allergies    Patient has no known allergies.    Review of Systems   Review of Systems  Physical Exam Updated Vital Signs BP 124/75   Pulse (!) 58   Temp (!) 97.5 F (36.4 C) (Oral)   Resp 18   SpO2 100%  Physical Exam Vitals and nursing note reviewed.  Constitutional:      General: He is not in acute distress.    Appearance: Normal appearance. He is not ill-appearing, toxic-appearing or diaphoretic.  HENT:     Head: Normocephalic.     Comments: No sign of lip lesion or abnormality Eyes:      Conjunctiva/sclera: Conjunctivae normal.  Cardiovascular:     Rate and Rhythm: Normal rate and regular rhythm.     Pulses: Normal pulses.  Pulmonary:     Effort: Pulmonary effort is normal. No respiratory distress.  Musculoskeletal:     Cervical back: No rigidity.  Skin:    General: Skin is warm and dry.     Coloration: Skin is not jaundiced or pale.  Neurological:     General: No focal deficit present.     Mental Status: He is alert and oriented to person, place, and time.     ED Results / Procedures / Treatments   Labs (all labs ordered are listed, but only abnormal results are displayed) Labs Reviewed  HIV ANTIBODY (ROUTINE TESTING W REFLEX)  RPR  GC/CHLAMYDIA PROBE AMP ([place]) NOT AT [facility]    EKG None  Radiology No results found.  Procedures Procedures    Medications Ordered in ED Medications  cefTRIAXone  (ROCEPHIN ) injection 500 mg (has no administration in time range)    ED Course/ Medical Decision Making/ A&P                                   22yo male who presents with concern for STI exposure.  Mild discharge, no other significant symptoms.  Urine GC/CHl. Blood HIV/RPR sent. Offered anal/throat swabs however agree to just empirically treat at this time.  Given rocephin , doxycycline  rx.  Discussed can use health department for STI testing. Patient discharged in stable condition with understanding of reasons to return.         Final Clinical Impression(s) / ED Diagnoses Final diagnoses:  Possible exposure to STI    Rx / DC Orders ED Discharge Orders          Ordered    doxycycline  (VIBRAMYCIN ) 100 MG capsule  2 times daily        07/26/23 9188              Dreama Longs, MD 07/26/23 [phone]

## 2023-07-27 LAB — GC/CHLAMYDIA PROBE AMP (~~LOC~~) NOT AT ARMC
Chlamydia: POSITIVE — AB
Comment: NEGATIVE
Comment: NORMAL
Neisseria Gonorrhea: NEGATIVE
